# Patient Record
Sex: Male | Born: 1990 | Race: Black or African American | Hispanic: No | Marital: Married | Smoking: Current every day smoker
Health system: Southern US, Community
[De-identification: ages and names within clinical notes are randomized; demographics above are authoritative.]

## PROBLEM LIST (undated history)

## (undated) DIAGNOSIS — G2581 Restless legs syndrome: Secondary | ICD-10-CM

## (undated) HISTORY — PX: ESOPHAGOGASTRODUODENOSCOPY: SHX1529

---

## 2016-01-19 ENCOUNTER — Emergency Department (HOSPITAL_BASED_OUTPATIENT_CLINIC_OR_DEPARTMENT_OTHER): Payer: Self-pay

## 2016-01-19 ENCOUNTER — Emergency Department (HOSPITAL_BASED_OUTPATIENT_CLINIC_OR_DEPARTMENT_OTHER)
Admission: EM | Admit: 2016-01-19 | Discharge: 2016-01-19 | Disposition: A | Discharge status: Home / Self Care | Payer: Self-pay | Attending: Emergency Medicine | Admitting: Emergency Medicine

## 2016-01-19 ENCOUNTER — Encounter (HOSPITAL_BASED_OUTPATIENT_CLINIC_OR_DEPARTMENT_OTHER): Payer: Self-pay | Admitting: *Deleted

## 2016-01-19 DIAGNOSIS — R1013 Epigastric pain: Secondary | ICD-10-CM

## 2016-01-19 DIAGNOSIS — K5900 Constipation, unspecified: Secondary | ICD-10-CM

## 2016-01-19 DIAGNOSIS — R630 Anorexia: Secondary | ICD-10-CM | POA: Insufficient documentation

## 2016-01-19 LAB — CBC WITH DIFFERENTIAL/PLATELET
BASOS PCT: 0 %
Basophils Absolute: 0 10*3/uL (ref 0.0–0.1)
EOS ABS: 0.1 10*3/uL (ref 0.0–0.7)
EOS PCT: 1 %
HCT: 43.9 % (ref 39.0–52.0)
HEMOGLOBIN: 14.9 g/dL (ref 13.0–17.0)
Lymphocytes Relative: 29 %
Lymphs Abs: 2.1 10*3/uL (ref 0.7–4.0)
MCH: 28 pg (ref 26.0–34.0)
MCHC: 33.9 g/dL (ref 30.0–36.0)
MCV: 82.4 fL (ref 78.0–100.0)
Monocytes Absolute: 0.6 10*3/uL (ref 0.1–1.0)
Monocytes Relative: 9 %
NEUTROS PCT: 61 %
Neutro Abs: 4.5 10*3/uL (ref 1.7–7.7)
PLATELETS: 170 10*3/uL (ref 150–400)
RBC: 5.33 MIL/uL (ref 4.22–5.81)
RDW: 12.4 % (ref 11.5–15.5)
WBC: 7.4 10*3/uL (ref 4.0–10.5)

## 2016-01-19 LAB — HEPATIC FUNCTION PANEL
ALT: 13 U/L — ABNORMAL LOW (ref 17–63)
AST: 17 U/L (ref 15–41)
Albumin: 4.1 g/dL (ref 3.5–5.0)
Alkaline Phosphatase: 59 U/L (ref 38–126)
Bilirubin, Direct: 0.1 mg/dL (ref 0.1–0.5)
Indirect Bilirubin: 1.2 mg/dL — ABNORMAL HIGH (ref 0.3–0.9)
Total Bilirubin: 1.3 mg/dL — ABNORMAL HIGH (ref 0.3–1.2)
Total Protein: 7.1 g/dL (ref 6.5–8.1)

## 2016-01-19 LAB — BASIC METABOLIC PANEL
Anion gap: 6 (ref 5–15)
BUN: 14 mg/dL (ref 6–20)
CHLORIDE: 104 mmol/L (ref 101–111)
CO2: 25 mmol/L (ref 22–32)
CREATININE: 1.05 mg/dL (ref 0.61–1.24)
Calcium: 8.7 mg/dL — ABNORMAL LOW (ref 8.9–10.3)
Glucose, Bld: 106 mg/dL — ABNORMAL HIGH (ref 65–99)
POTASSIUM: 3.7 mmol/L (ref 3.5–5.1)
SODIUM: 135 mmol/L (ref 135–145)

## 2016-01-19 LAB — URINALYSIS, ROUTINE W REFLEX MICROSCOPIC
Bilirubin Urine: NEGATIVE
Glucose, UA: NEGATIVE mg/dL
Hgb urine dipstick: NEGATIVE
Ketones, ur: NEGATIVE mg/dL
Leukocytes, UA: NEGATIVE
Nitrite: NEGATIVE
Protein, ur: NEGATIVE mg/dL
Specific Gravity, Urine: 1.019 (ref 1.005–1.030)
pH: 6.5 (ref 5.0–8.0)

## 2016-01-19 LAB — LIPASE, BLOOD: Lipase: 24 U/L (ref 11–51)

## 2016-01-19 MED ORDER — POLYETHYLENE GLYCOL 3350 17 GM/SCOOP PO POWD: 1.0000 | Freq: Once | ORAL | Status: DC

## 2016-01-19 MED ORDER — IOHEXOL 300 MG/ML  SOLN
100.0000 mL | Freq: Once | INTRAMUSCULAR | Status: AC | PRN
  Administered 2016-01-19: 100 mL via INTRAVENOUS

## 2016-01-19 MED ORDER — IOHEXOL 300 MG/ML  SOLN
25.0000 mL | Freq: Once | INTRAMUSCULAR | Status: AC | PRN
  Administered 2016-01-19: 25 mL via ORAL

## 2016-01-19 MED ORDER — GI COCKTAIL ~~LOC~~
30.0000 mL | Freq: Once | ORAL | Status: AC
  Administered 2016-01-19: 30 mL via ORAL
  Filled 2016-01-19: qty 30

## 2016-01-19 MED ORDER — SODIUM CHLORIDE 0.9 % IV BOLUS (SEPSIS)
1000.0000 mL | Freq: Once | INTRAVENOUS | Status: AC
  Administered 2016-01-19: 1000 mL via INTRAVENOUS

## 2016-01-19 NOTE — Discharge Instructions (Signed)
Abdominal Pain, Adult °Many things can cause abdominal pain. Usually, abdominal pain is not caused by a disease and will improve without treatment. It can often be observed and treated at home. Your health care provider will do a physical exam and possibly order blood tests and X-rays to help determine the seriousness of your pain. However, in many cases, more time must pass before a clear cause of the pain can be found. Before that point, your health care provider may not know if you need more testing or further treatment. °HOME CARE INSTRUCTIONS °Monitor your abdominal pain for any changes. The following actions may help to alleviate any discomfort you are experiencing: °· Only take over-the-counter or prescription medicines as directed by your health care provider. °· Do not take laxatives unless directed to do so by your health care provider. °· Try a clear liquid diet (broth, tea, or water) as directed by your health care provider. Slowly move to a bland diet as tolerated. °SEEK MEDICAL CARE IF: °· You have unexplained abdominal pain. °· You have abdominal pain associated with nausea or diarrhea. °· You have pain when you urinate or have a bowel movement. °· You experience abdominal pain that wakes you in the night. °· You have abdominal pain that is worsened or improved by eating food. °· You have abdominal pain that is worsened with eating fatty foods. °· You have a fever. °SEEK IMMEDIATE MEDICAL CARE IF: °· Your pain does not go away within 2 hours. °· You keep throwing up (vomiting). °· Your pain is felt only in portions of the abdomen, such as the right side or the left lower portion of the abdomen. °· You pass bloody or black tarry stools. °MAKE SURE YOU: °· Understand these instructions. °· Will watch your condition. °· Will get help right away if you are not doing well or get worse. °  °This information is not intended to replace advice given to you by your health care provider. Make sure you discuss  any questions you have with your health care provider. °  °Document Released: 09/15/2005 Document Revised: 08/27/2015 Document Reviewed: 08/15/2013 °Elsevier Interactive Patient Education ©2016 [employer] ° °Constipation, Adult °Constipation is when a person: °· Poops (has a bowel movement) less than 3 times a week. °· Has a hard time pooping. °· Has poop that is dry, hard, or bigger than normal. °HOME CARE  °· Eat foods with a lot of fiber in them. This includes fruits, vegetables, beans, and whole grains such as brown rice. °· Avoid fatty foods and foods with a lot of sugar. This includes french fries, hamburgers, cookies, candy, and soda. °· If you are not getting enough fiber from food, take products with added fiber in them (supplements). °· Drink enough fluid to keep your pee (urine) clear or pale yellow. °· Exercise on a regular basis, or as told by your doctor. °· Go to the restroom when you feel like you need to poop. Do not hold it. °· Only take medicine as told by your doctor. Do not take medicines that help you poop (laxatives) without talking to your doctor first. °GET HELP RIGHT AWAY IF:  °· You have bright red blood in your poop (stool). °· Your constipation lasts more than 4 days or gets worse. °· You have belly (abdominal) or butt (rectal) pain. °· You have thin poop (as thin as a pencil). °· You lose weight, and it cannot be explained. °MAKE SURE YOU:  °· Understand these instructions. °·   Will watch your condition.  Will get help right away if you are not doing well or get worse.   This information is not intended to replace advice given to you by your health care provider. Make sure you discuss any questions you have with your health care provider.   Take MiraLAX daily. Adjust dose as needed. Follow-up with her primary care doctor. Return to the ED if you experience increasing your pain, fever, chills, blood in your stool, blood in your vomit.

## 2016-01-19 NOTE — ED Notes (Signed)
C/o sharp abd pain x 1 week w nausea,  Denies vomiting, denies penile dc,  No fever or diarrhea

## 2016-01-19 NOTE — ED Notes (Signed)
Abd pain and nausea x 1 week. Reports decreased appetite. Point to epigastric area for location of pain

## 2016-01-21 NOTE — ED Provider Notes (Signed)
CSN: [id number]     Arrival date & time 01/19/16  1950 History   First MD Initiated Contact with Patient 01/19/16 2049     Chief Complaint  Patient presents with  . Abdominal Pain     (Consider location/radiation/quality/duration/timing/severity/associated sxs/prior Treatment) HPI   Dustin Wheeler is a 25 y.o M with no significant pmhx who presents to the ED c/o abdominal pain. Pt states that over the last week he has has intermittent epigastric abdominal pain that is sharp in nature. Pain is worsened with movement. No alleviating factors. Pt also has associated nausea and decreased appetite. No vomiting or diarrhea. Pt has also been constipated, last BM was 3 days ago. Pt is still passing gas. Denies fever, chills, melena, hematochezia, dysuria.   History reviewed. No pertinent past medical history. History reviewed. No pertinent past surgical history. No family history on file. Social History  Substance Use Topics  . Smoking status: Never Smoker   . Smokeless tobacco: Never Used  . Alcohol Use: No    Review of Systems  All other systems reviewed and are negative.     Allergies  Review of patient's allergies indicates no known allergies.  Home Medications   Prior to Admission medications   Medication Sig Start Date End Date Taking? Authorizing Provider  polyethylene glycol powder (GLYCOLAX) powder Take 255 g by mouth once. 01/19/16   Samantha Tripp Dowless, PA-C   BP 126/63 mmHg  Pulse 61  Resp 16  SpO2 100% Physical Exam  Constitutional: He is oriented to person, place, and time. He appears well-developed and well-nourished. No distress.  HENT:  Head: Normocephalic and atraumatic.  Mouth/Throat: No oropharyngeal exudate.  Eyes: Conjunctivae and EOM are normal. Pupils are equal, round, and reactive to light. Right eye exhibits no discharge. Left eye exhibits no discharge. No scleral icterus.  Cardiovascular: Normal rate, regular rhythm, normal heart sounds and intact  distal pulses.  Exam reveals no gallop and no friction rub.   No murmur heard. Pulmonary/Chest: Effort normal and breath sounds normal. No respiratory distress. He has no wheezes. He has no rales. He exhibits no tenderness.  Abdominal: Soft. Bowel sounds are normal. He exhibits no distension and no mass. There is no tenderness. There is no rebound and no guarding.  Musculoskeletal: Normal range of motion. He exhibits no edema.  Neurological: He is alert and oriented to person, place, and time.  Skin: Skin is warm and dry. No rash noted. He is not diaphoretic. No erythema. No pallor.  Psychiatric: He has a normal mood and affect. His behavior is normal.  Nursing note and vitals reviewed.   ED Course  Procedures (including critical care time) Labs Review Labs Reviewed  BASIC METABOLIC PANEL - Abnormal; Notable for the following:    Glucose, Bld 106 (*)    Calcium 8.7 (*)    All other components within normal limits  HEPATIC FUNCTION PANEL - Abnormal; Notable for the following:    ALT 13 (*)    Total Bilirubin 1.3 (*)    Indirect Bilirubin 1.2 (*)    All other components within normal limits  CBC WITH DIFFERENTIAL/PLATELET  LIPASE, BLOOD  URINALYSIS, ROUTINE W REFLEX MICROSCOPIC (NOT AT [facility])    Imaging Review Ct Abdomen Pelvis W Contrast  01/19/2016  CLINICAL DATA:  Epigastric pain for 2 weeks EXAM: CT ABDOMEN AND PELVIS WITH CONTRAST TECHNIQUE: Multidetector CT imaging of the abdomen and pelvis was performed using the standard protocol following bolus administration of intravenous contrast. CONTRAST:  OMNIPAQUE IOHEXOL 300 MG/ML SOLN, 25mL OMNIPAQUE IOHEXOL 300 MG/ML SOLN COMPARISON:  None. FINDINGS: Lower chest: The lung bases are clear. No pleural or pericardial effusion. Hepatobiliary: No focal liver abnormality. The gallbladder is normal. No biliary dilatation. Pancreas: Negative Spleen: The spleen is normal. Adrenals/Urinary Tract: The adrenal glands are normal. Normal  appearance of both kidneys. The urinary bladder appears normal. Stomach/Bowel: The stomach and the small bowel loops have a normal course and caliber. No evidence for bowel obstruction. The appendix is visualized and appears normal. No pathologic dilatation of the large bowel loops. Vascular/Lymphatic: Normal appearance of the abdominal aorta. No enlarged retroperitoneal or mesenteric adenopathy. No enlarged pelvic or inguinal lymph nodes. Reproductive: The prostate gland and seminal vesicles are unremarkable. Other: There is no ascites or focal fluid collections within the abdomen or pelvis. There is a small periumbilical hernia which contains fat only, image 57 of series 6. Musculoskeletal: No aggressive lytic or sclerotic bone lesions. IMPRESSION: 1. No acute findings within the abdomen or pelvis. 2. Small periumbilical hernia contains fat only. Electronically Signed   By: Signa Kell M.D.   On: 01/19/2016 23:06   I have personally reviewed and evaluated these images and lab results as part of my medical decision-making.   EKG Interpretation None      MDM   Final diagnoses:  Epigastric pain  Constipation, unspecified constipation type    Patient is nontoxic, nonseptic appearing, in no apparent distress.  Pt denies current pain or nausea.  Fluid bolus given.  Labs, imaging and vitals reviewed.  Patient does not meet the SIRS or Sepsis criteria.  On repeat exam patient does not have a surgical abdomin and there are no peritoneal signs.  No indication of appendicitis, bowel obstruction, bowel perforation, cholecystitis, diverticulitis.  Patient discharged home with symptomatic treatment and given strict instructions for follow-up with their primary care physician. Pt given miralax for constipation. I have also discussed reasons to return immediately to the ER.  Patient expresses understanding and agrees with plan.       Lester Kinsman [place], PA-C 01/21/16 2145  Geoffery Lyons,  MD 01/22/16 [phone]

## 2017-02-21 ENCOUNTER — Emergency Department (HOSPITAL_BASED_OUTPATIENT_CLINIC_OR_DEPARTMENT_OTHER)
Admission: EM | Admit: 2017-02-21 | Discharge: 2017-02-21 | Disposition: A | Discharge status: Home / Self Care | Payer: Self-pay | Attending: Emergency Medicine | Admitting: Emergency Medicine

## 2017-02-21 ENCOUNTER — Encounter (HOSPITAL_BASED_OUTPATIENT_CLINIC_OR_DEPARTMENT_OTHER): Payer: Self-pay | Admitting: *Deleted

## 2017-02-21 DIAGNOSIS — X58XXXA Exposure to other specified factors, initial encounter: Secondary | ICD-10-CM | POA: Insufficient documentation

## 2017-02-21 DIAGNOSIS — T1501XA Foreign body in cornea, right eye, initial encounter: Secondary | ICD-10-CM | POA: Insufficient documentation

## 2017-02-21 DIAGNOSIS — Y939 Activity, unspecified: Secondary | ICD-10-CM | POA: Insufficient documentation

## 2017-02-21 DIAGNOSIS — Y999 Unspecified external cause status: Secondary | ICD-10-CM | POA: Insufficient documentation

## 2017-02-21 DIAGNOSIS — Y929 Unspecified place or not applicable: Secondary | ICD-10-CM | POA: Insufficient documentation

## 2017-02-21 MED ORDER — TETRACAINE HCL 0.5 % OP SOLN
1.0000 [drp] | Freq: Once | OPHTHALMIC | Status: AC
  Administered 2017-02-21: 1 [drp] via OPHTHALMIC

## 2017-02-21 MED ORDER — TETRACAINE HCL 0.5 % OP SOLN
1.0000 [drp] | Freq: Once | OPHTHALMIC | Status: AC
  Administered 2017-02-21: 1 [drp] via OPHTHALMIC
  Filled 2017-02-21: qty 4

## 2017-02-21 MED ORDER — CIPROFLOXACIN HCL 0.3 % OP SOLN: 2.0000 [drp] | OPHTHALMIC | Status: DC

## 2017-02-21 MED ORDER — HYDROCODONE-ACETAMINOPHEN 5-325 MG PO TABS: 1.0000 | ORAL_TABLET | Freq: Four times a day (QID) | ORAL | 0 refills | Status: DC | PRN

## 2017-02-21 MED ORDER — FLUORESCEIN SODIUM 0.6 MG OP STRP
1.0000 | ORAL_STRIP | Freq: Once | OPHTHALMIC | Status: DC
  Filled 2017-02-21: qty 1

## 2017-02-21 MED ORDER — OFLOXACIN 0.3 % OP SOLN
OPHTHALMIC | Status: AC
  Administered 2017-02-21: 2 [drp]
  Filled 2017-02-21: qty 5

## 2017-02-21 NOTE — ED Notes (Signed)
ED Provider at bedside. 

## 2017-02-21 NOTE — ED Notes (Signed)
Pt is resting comfortably. Eyes closed.

## 2017-02-21 NOTE — ED Notes (Signed)
MD with pt  

## 2017-02-21 NOTE — ED Triage Notes (Signed)
Pt states he may have gotten a piece of metal in his eye yesterday. Unable to hold eye open enough to really see out of it

## 2017-02-21 NOTE — ED Notes (Signed)
Pt was triaged by this RN.

## 2017-02-21 NOTE — ED Provider Notes (Addendum)
[facility] Provider Note: Lowella DellJ. Lane Johny Pitstick, MD, FACEP  CSN: [id number] MRN: [id number] ARRIVAL: 02/21/17 at 0025 ROOM: [facility]   CHIEF COMPLAINT  Eye Injury   HISTORY OF PRESENT ILLNESS  Dustin KocherRicky Givhan is a 26 y.o. male who was using a grinder yesterday and believes he got a speck of metal in his right eye. He is here with moderate foreign body sensation. There is increased tearing of the right eye with associated redness. His vision is not significantly blurry. He has not been able to wash the object from his eye. Pain is worse with exposure to light.  [place] reveals the patient has received No opioid prescriptions in the past year.   History reviewed. No pertinent past medical history.  History reviewed. No pertinent surgical history.  History reviewed. No pertinent family history.  Social History  Substance Use Topics  . Smoking status: Never Smoker  . Smokeless tobacco: Never Used  . Alcohol use No    Prior to Admission medications   Not on File    Allergies Patient has no known allergies.   REVIEW OF SYSTEMS  Negative except as noted here or in the History of Present Illness.   PHYSICAL EXAMINATION  Initial Vital Signs Blood pressure 146/65, pulse 64, temperature 98 F (36.7 C), temperature source Oral, resp. rate 18, height 6\' 2"  (1.88 m), weight 160 lb (72.6 kg), SpO2 97 %.  Examination General: Well-developed, well-nourished male in no acute distress; appearance consistent with age of record HENT: normocephalic; atraumatic Eyes: pupils equal, round and reactive to light; extraocular muscles intact; right conjunctival injection with increased tearing; small metallic appearing foreign object in lateral edge of right cornea Neck: supple Heart: regular rate and rhythm Lungs: Normal respiratory effort and excursion Abdomen: soft; nondistended Extremities: No deformity; full range of  motion Neurologic: Awake, alert and oriented; motor function intact in all extremities and symmetric; no facial droop Skin: Warm and dry Psychiatric: Anxious   RESULTS  Summary of this visit's results, reviewed by myself:   EKG Interpretation  Date/Time:    Ventricular Rate:    PR Interval:    QRS Duration:   QT Interval:    QTC Calculation:   R Axis:     Text Interpretation:        Laboratory Studies: No results found for this or any previous visit (from the past 24 hour(s)). Imaging Studies: No results found.  ED COURSE  Nursing notes and initial vitals signs, including pulse oximetry, reviewed.  Vitals:   02/21/17 0036 02/21/17 0037  BP: 146/65   Pulse: 64   Resp: 18   Temp: 98 F (36.7 C)   TempSrc: Oral   SpO2: 97%   Weight:  160 lb (72.6 kg)  Height:  6\' 2"  (1.88 m)    PROCEDURES   FOREIGN BODY REMOVAL The right eye was anesthetized with several drops of tetracaine solution. An 18-gauge needle was then used to remove the foreign object from the right cornea. There appears to be a faint rust ring remaining. The patient tolerated this well and there were no immediate complications. We will refer to ophthalmology for reevaluation later today.  ED DIAGNOSES     ICD-9-CM ICD-10-CM   1. 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

## 2017-07-13 ENCOUNTER — Emergency Department (HOSPITAL_BASED_OUTPATIENT_CLINIC_OR_DEPARTMENT_OTHER): Payer: Self-pay

## 2017-07-13 ENCOUNTER — Encounter (HOSPITAL_BASED_OUTPATIENT_CLINIC_OR_DEPARTMENT_OTHER): Payer: Self-pay

## 2017-07-13 ENCOUNTER — Emergency Department (HOSPITAL_BASED_OUTPATIENT_CLINIC_OR_DEPARTMENT_OTHER)
Admission: EM | Admit: 2017-07-13 | Discharge: 2017-07-13 | Disposition: A | Discharge status: Home / Self Care | Payer: Self-pay | Attending: Emergency Medicine | Admitting: Emergency Medicine

## 2017-07-13 DIAGNOSIS — R519 Headache, unspecified: Secondary | ICD-10-CM

## 2017-07-13 DIAGNOSIS — F17200 Nicotine dependence, unspecified, uncomplicated: Secondary | ICD-10-CM | POA: Insufficient documentation

## 2017-07-13 DIAGNOSIS — R51 Headache: Secondary | ICD-10-CM | POA: Insufficient documentation

## 2017-07-13 MED ORDER — ACETAMINOPHEN 500 MG PO TABS
1000.0000 mg | ORAL_TABLET | Freq: Once | ORAL | Status: AC
  Administered 2017-07-13: 1000 mg via ORAL
  Filled 2017-07-13: qty 2

## 2017-07-13 NOTE — Discharge Instructions (Signed)
It was our pleasure to provide your ER care today - we hope that you feel better.  Rest. Drink adequate fluids.  Try take excedrin migraine as need for headache.  Given frequency of headaches, follow up with neurologist/headache specialist in the next few weeks - see referral - call to arrange appointment.  Return to ER if worse, new symptoms, other medical emergency.

## 2017-07-13 NOTE — ED Provider Notes (Signed)
[facility]   CSN: [id number] Arrival date & time: 07/13/17  1349     History   Chief Complaint Chief Complaint  Patient presents with  . Headache    HPI Letta KocherRicky Ofarrell is a 26 y.o. male.  Patient c/o headaches for the past 4-5 months.  States only occasional headache prior to that but having daily/constant headaches for 4-5 months. Located frontal and superior. Pain is dull, moderate, constant, throbbing, without radiation. No specific exacerbating or alleviating factors. No neck pain or stiffness. No eye pain or change in vision. No change in speech. No numbness/weakness or change in normal functional ability. No personal or fam hx migraines. Denies sinus drainage or congestion. No allergy symptoms. No fever or chills.    The history is provided by the patient.  Headache   Pertinent negatives include no fever, no shortness of breath and no vomiting.    History reviewed. No pertinent past medical history.  There are no active problems to display for this patient.   History reviewed. No pertinent surgical history.     Home Medications    Prior to Admission medications   Not on File    Family History No family history on file.  Social History Social History  Substance Use Topics  . Smoking status: Current Every Day Smoker  . Smokeless tobacco: Never Used  . Alcohol use No     Allergies   Patient has no known allergies.   Review of Systems Review of Systems  Constitutional: Negative for fever.  HENT: Negative for sinus pain and sore throat.   Eyes: Negative for pain and visual disturbance.  Respiratory: Negative for shortness of breath.   Cardiovascular: Negative for chest pain.  Gastrointestinal: Negative for abdominal pain and vomiting.  Genitourinary: Negative for flank pain.  Musculoskeletal: Negative for back pain, neck pain and neck stiffness.  Skin: Negative for rash.  Neurological: Positive for headaches. Negative for  weakness and numbness.  Hematological: Does not bruise/bleed easily.  Psychiatric/Behavioral: Negative for confusion.     Physical Exam Updated Vital Signs BP 132/82 (BP Location: Left Arm)   Pulse 74   Temp 98.5 F (36.9 C) (Oral)   Resp 18   Ht 1.88 m (6\' 2" )   Wt 74.8 kg (165 lb)   SpO2 100%   BMI 21.18 kg/m   Physical Exam  Constitutional: He is oriented to person, place, and time. He appears well-developed and well-nourished. No distress.  HENT:  Mouth/Throat: Oropharynx is clear and moist.  No sinus or temporal tenderness.   Eyes: Conjunctivae are normal.  Neck: Neck supple. No tracheal deviation present.  Cardiovascular: Normal rate.   Pulmonary/Chest: Effort normal. No accessory muscle usage. No respiratory distress.  Abdominal: He exhibits no distension.  Musculoskeletal: He exhibits no edema.  Neurological: He is alert and oriented to person, place, and time. No cranial nerve deficit.  Motor intact bil, stre 5/5. sens grossly intact. Steady gait  Skin: Skin is warm and dry. No rash noted.  Psychiatric: He has a normal mood and affect.  Nursing note and vitals reviewed.    ED Treatments / Results  Labs (all labs ordered are listed, but only abnormal results are displayed) Labs Reviewed - No data to display  EKG  EKG Interpretation None       Radiology Ct Head Wo Contrast  Result Date: 07/13/2017 CLINICAL DATA:  Left-sided headache for several months. EXAM: CT HEAD WITHOUT CONTRAST TECHNIQUE: Contiguous axial images were obtained  from the base of the skull through the vertex without intravenous contrast. COMPARISON:  None. FINDINGS: Brain: No acute infarct, hemorrhage, or mass lesion is present. The ventricles are of normal size. No significant extraaxial fluid collection is present. The brainstem and cerebellum are normal. Vascular: No hyperdense vessel or unexpected calcification. Skull: The calvarium is intact. No focal lytic or blastic lesions are  present. Sinuses/Orbits: The paranasal sinuses and mastoid air cells are clear. The globes and orbits are within normal limits. IMPRESSION: Negative CT of the head. Electronically Signed   By: Marin Robertshristopher  Mattern M.D.   On: 07/13/2017 14:27    Procedures Procedures (including critical care time)  Medications Ordered in ED Medications  acetaminophen (TYLENOL) tablet 1,000 mg (1,000 mg Oral Given 07/13/17 1422)     Initial Impression / Assessment and Plan / ED Course  I have reviewed the triage vital signs and the nursing notes.  Pertinent labs & imaging results that were available during my care of the patient were reviewed by me and considered in my medical decision making (see chart for details).  Took ibuprofen earlier today.  Acetaminophen po.  As constant, daily headaches x 4-5 months, will get imaging study.  Ct neg acute.   Patient appears stable for d/c.     Final Clinical Impressions(s) / ED Diagnoses   Final diagnoses:  None    New Prescriptions New Prescriptions   No medications on file     Cathren LaineSteinl, Ishan Sanroman, MD 07/13/17 1432

## 2017-07-13 NOTE — ED Triage Notes (Signed)
C/o HA "for months"-came in to day because he had a HA yesterday at work and vomited x 1-NAD-steady gait

## 2017-07-22 ENCOUNTER — Emergency Department (HOSPITAL_BASED_OUTPATIENT_CLINIC_OR_DEPARTMENT_OTHER)
Admission: EM | Admit: 2017-07-22 | Discharge: 2017-07-23 | Disposition: A | Discharge status: Home / Self Care | Payer: Self-pay | Attending: Emergency Medicine | Admitting: Emergency Medicine

## 2017-07-22 ENCOUNTER — Encounter (HOSPITAL_BASED_OUTPATIENT_CLINIC_OR_DEPARTMENT_OTHER): Payer: Self-pay | Admitting: *Deleted

## 2017-07-22 DIAGNOSIS — R112 Nausea with vomiting, unspecified: Secondary | ICD-10-CM

## 2017-07-22 DIAGNOSIS — R519 Headache, unspecified: Secondary | ICD-10-CM

## 2017-07-22 DIAGNOSIS — F17210 Nicotine dependence, cigarettes, uncomplicated: Secondary | ICD-10-CM | POA: Insufficient documentation

## 2017-07-22 DIAGNOSIS — R51 Headache: Secondary | ICD-10-CM | POA: Insufficient documentation

## 2017-07-22 NOTE — ED Triage Notes (Addendum)
Pt c/o abd pain n/v x 4 days Pt states taking Excedrin migraine x 1 week for h/a

## 2017-07-23 LAB — COMPREHENSIVE METABOLIC PANEL
ALBUMIN: 4.5 g/dL (ref 3.5–5.0)
ALT: 12 U/L — ABNORMAL LOW (ref 17–63)
ANION GAP: 10 (ref 5–15)
AST: 18 U/L (ref 15–41)
Alkaline Phosphatase: 57 U/L (ref 38–126)
BUN: 13 mg/dL (ref 6–20)
CO2: 26 mmol/L (ref 22–32)
Calcium: 9 mg/dL (ref 8.9–10.3)
Chloride: 100 mmol/L — ABNORMAL LOW (ref 101–111)
Creatinine, Ser: 1 mg/dL (ref 0.61–1.24)
GFR calc non Af Amer: >60 mL/min (ref >60)
GLUCOSE: 105 mg/dL — AB (ref 65–99)
POTASSIUM: 3.8 mmol/L (ref 3.5–5.1)
SODIUM: 136 mmol/L (ref 135–145)
TOTAL PROTEIN: 7.3 g/dL (ref 6.5–8.1)
Total Bilirubin: 1.5 mg/dL — ABNORMAL HIGH (ref 0.3–1.2)

## 2017-07-23 LAB — CBC WITH DIFFERENTIAL/PLATELET
BASOS PCT: 0 %
Basophils Absolute: 0 10*3/uL (ref 0.0–0.1)
EOS ABS: 0 10*3/uL (ref 0.0–0.7)
EOS PCT: 0 %
HCT: 45.5 % (ref 39.0–52.0)
Hemoglobin: 16 g/dL (ref 13.0–17.0)
Lymphocytes Relative: 16 %
Lymphs Abs: 1.7 10*3/uL (ref 0.7–4.0)
MCH: 29 pg (ref 26.0–34.0)
MCHC: 35.2 g/dL (ref 30.0–36.0)
MCV: 82.4 fL (ref 78.0–100.0)
MONO ABS: 0.9 10*3/uL (ref 0.1–1.0)
MONOS PCT: 8 %
Neutro Abs: 8.2 10*3/uL — ABNORMAL HIGH (ref 1.7–7.7)
Neutrophils Relative %: 76 %
Platelets: 202 10*3/uL (ref 150–400)
RBC: 5.52 MIL/uL (ref 4.22–5.81)
RDW: 13 % (ref 11.5–15.5)
WBC: 10.8 10*3/uL — ABNORMAL HIGH (ref 4.0–10.5)

## 2017-07-23 LAB — URINALYSIS, ROUTINE W REFLEX MICROSCOPIC
Glucose, UA: NEGATIVE mg/dL
HGB URINE DIPSTICK: NEGATIVE
Ketones, ur: 15 mg/dL — AB
Leukocytes, UA: NEGATIVE
Nitrite: NEGATIVE
PH: 5.5 (ref 5.0–8.0)
Protein, ur: NEGATIVE mg/dL
SPECIFIC GRAVITY, URINE: 1.031 — AB (ref 1.005–1.030)

## 2017-07-23 LAB — LIPASE, BLOOD: Lipase: 24 U/L (ref 11–51)

## 2017-07-23 MED ORDER — METOCLOPRAMIDE HCL 10 MG PO TABS: 10.0000 mg | ORAL_TABLET | Freq: Four times a day (QID) | ORAL | 0 refills | Status: DC | PRN

## 2017-07-23 MED ORDER — SODIUM CHLORIDE 0.9 % IV BOLUS (SEPSIS): 1000.0000 mL | Freq: Once | INTRAVENOUS | Status: DC

## 2017-07-23 MED ORDER — PANTOPRAZOLE SODIUM 40 MG IV SOLR
40.0000 mg | Freq: Once | INTRAVENOUS | Status: AC
  Administered 2017-07-23: 40 mg via INTRAVENOUS
  Filled 2017-07-23: qty 40

## 2017-07-23 MED ORDER — DIPHENHYDRAMINE HCL 50 MG/ML IJ SOLN
25.0000 mg | Freq: Once | INTRAMUSCULAR | Status: AC
  Administered 2017-07-23: 25 mg via INTRAVENOUS
  Filled 2017-07-23: qty 1

## 2017-07-23 MED ORDER — KETOROLAC TROMETHAMINE 15 MG/ML IJ SOLN
15.0000 mg | Freq: Once | INTRAMUSCULAR | Status: AC
  Administered 2017-07-23: 15 mg via INTRAVENOUS
  Filled 2017-07-23: qty 1

## 2017-07-23 MED ORDER — SODIUM CHLORIDE 0.9 % IV BOLUS (SEPSIS)
1000.0000 mL | Freq: Once | INTRAVENOUS | Status: AC
  Administered 2017-07-23: 1000 mL via INTRAVENOUS

## 2017-07-23 MED ORDER — ONDANSETRON HCL 4 MG/2ML IJ SOLN
4.0000 mg | Freq: Once | INTRAMUSCULAR | Status: AC
  Administered 2017-07-23: 4 mg via INTRAVENOUS
  Filled 2017-07-23: qty 2

## 2017-07-23 MED ORDER — METOCLOPRAMIDE HCL 5 MG/ML IJ SOLN
10.0000 mg | Freq: Once | INTRAMUSCULAR | Status: AC
  Administered 2017-07-23: 10 mg via INTRAVENOUS
  Filled 2017-07-23: qty 2

## 2017-07-23 NOTE — ED Provider Notes (Signed)
[facility] Provider Note: Dustin DellJ. Lane Jeslie Lowe, MD, FACEP  CSN: [id number] MRN: [id number] ARRIVAL: 07/22/17 at 2352 ROOM: MH09/MH09   CHIEF COMPLAINT  Abdominal Pain   HISTORY OF PRESENT ILLNESS  07/23/17 12:27 AM Dustin Wheeler is a 26 y.o. male with a four-day history of nausea and vomiting. He is typically vomited once per day. He has had decreased oral intake because eating makes him nauseated. His vomiting is preceded by epigastric pain which she rates as an 8 out of 10. The pain is resolved by vomiting. He denies abdominal pain is present time. He denies a headache at the present time but has been having headaches recently for which he has been taking over-the-counter migraine tablets. He denies diarrhea. He denies fever.   History reviewed. No pertinent past medical history.  History reviewed. No pertinent surgical history.  History reviewed. No pertinent family history.  Social History  Substance Use Topics  . Smoking status: Current Every Day Smoker  . Smokeless tobacco: Never Used  . Alcohol use No    Prior to Admission medications   Not on File    Allergies Patient has no known allergies.   REVIEW OF SYSTEMS  Negative except as noted here or in the History of Present Illness.   PHYSICAL EXAMINATION  Initial Vital Signs Blood pressure (!) 141/86, pulse 84, temperature 98.8 F (37.1 C), temperature source Oral, resp. rate 16, height 6\' 2"  (1.88 m), weight 74.8 kg (165 lb), SpO2 97 %.  Examination General: Well-developed, well-nourished male in no acute distress; appearance consistent with age of record HENT: normocephalic; atraumatic Eyes: pupils equal, round and reactive to light; extraocular muscles intact Neck: supple Heart: regular rate and rhythm Lungs: clear to auscultation bilaterally Abdomen: soft; nondistended; mild epigastric tenderness; no masses or hepatosplenomegaly; bowel sounds present Extremities: No deformity; full range of motion;  pulses normal Neurologic: Awake, alert and oriented; motor function intact in all extremities and symmetric; no facial droop Skin: Warm and dry Psychiatric: Flat affect   RESULTS  Summary of this visit's results, reviewed by myself:   EKG Interpretation  Date/Time:    Ventricular Rate:    PR Interval:    QRS Duration:   QT Interval:    QTC Calculation:   R Axis:     Text Interpretation:        Laboratory Studies: Results for orders placed or performed during the hospital encounter of 07/22/17 (from the past 24 hour(s))  CBC with Differential     Status: Abnormal   Collection Time: 07/23/17 12:24 AM  Result Value Ref Range   WBC 10.8 (H) 4.0 - 10.5 K/uL   RBC 5.52 4.22 - 5.81 MIL/uL   Hemoglobin 16.0 13.0 - 17.0 g/dL   HCT 78.245.5 95.639.0 - 21.352.0 %   MCV 82.4 78.0 - 100.0 fL   MCH 29.0 26.0 - 34.0 pg   MCHC 35.2 30.0 - 36.0 g/dL   RDW 08.613.0 57.811.5 - 46.915.5 %   Platelets 202 150 - 400 K/uL   Neutrophils Relative % 76 %   Neutro Abs 8.2 (H) 1.7 - 7.7 K/uL   Lymphocytes Relative 16 %   Lymphs Abs 1.7 0.7 - 4.0 K/uL   Monocytes Relative 8 %   Monocytes Absolute 0.9 0.1 - 1.0 K/uL   Eosinophils Relative 0 %   Eosinophils Absolute 0.0 0.0 - 0.7 K/uL   Basophils Relative 0 %   Basophils Absolute 0.0 0.0 - 0.1 K/uL  Comprehensive metabolic panel  Status: Abnormal   Collection Time: 07/23/17 12:24 AM  Result Value Ref Range   Sodium 136 135 - 145 mmol/L   Potassium 3.8 3.5 - 5.1 mmol/L   Chloride 100 (L) 101 - 111 mmol/L   CO2 26 22 - 32 mmol/L   Glucose, Bld 105 (H) 65 - 99 mg/dL   BUN 13 6 - 20 mg/dL   Creatinine, Ser 1.611.00 0.61 - 1.24 mg/dL   Calcium 9.0 8.9 - 09.610.3 mg/dL   Total Protein 7.3 6.5 - 8.1 g/dL   Albumin 4.5 3.5 - 5.0 g/dL   AST 18 15 - 41 U/L   ALT 12 (L) 17 - 63 U/L   Alkaline Phosphatase 57 38 - 126 U/L   Total Bilirubin 1.5 (H) 0.3 - 1.2 mg/dL   GFR calc non Af Amer >60 >60 mL/min   GFR calc Af Amer >60 >60 mL/min   Anion gap 10 5 - 15  Lipase, blood      Status: None   Collection Time: 07/23/17 12:24 AM  Result Value Ref Range   Lipase 24 11 - 51 U/L  Urinalysis, Routine w reflex microscopic     Status: Abnormal   Collection Time: 07/23/17  1:28 AM  Result Value Ref Range   Color, Urine AMBER (A) YELLOW   APPearance CLEAR CLEAR   Specific Gravity, Urine 1.031 (H) 1.005 - 1.030   pH 5.5 5.0 - 8.0   Glucose, UA NEGATIVE NEGATIVE mg/dL   Hgb urine dipstick NEGATIVE NEGATIVE   Bilirubin Urine SMALL (A) NEGATIVE   Ketones, ur 15 (A) NEGATIVE mg/dL   Protein, ur NEGATIVE NEGATIVE mg/dL   Nitrite NEGATIVE NEGATIVE   Leukocytes, UA NEGATIVE NEGATIVE   Imaging Studies: No results found.  ED COURSE  Nursing notes and initial vitals signs, including pulse oximetry, reviewed.  Vitals:   07/22/17 2356 07/22/17 2357  BP:  (!) 141/86  Pulse:  84  Resp:  16  Temp:  98.8 F (37.1 C)  TempSrc:  Oral  SpO2:  97%  Weight: 74.8 kg (165 lb)   Height: 6\' 2"  (1.88 m)    3:56 AM Patient's nausea relieved with IV Zofran but patient developed a headache soon after. Headache was treated with Toradol, Benadryl and Reglan with improvement. Headache now significantly improved.  PROCEDURES    ED DIAGNOSES     ICD-10-CM   1. Nausea and vomiting in adult R11.2   2. Recurrent headache R51        Dustin Wheeler, Dustin RuizJohn, MD 07/23/17 [phone]

## 2017-10-10 ENCOUNTER — Encounter (HOSPITAL_BASED_OUTPATIENT_CLINIC_OR_DEPARTMENT_OTHER): Payer: Self-pay | Admitting: *Deleted

## 2017-10-10 ENCOUNTER — Emergency Department (HOSPITAL_BASED_OUTPATIENT_CLINIC_OR_DEPARTMENT_OTHER)
Admission: EM | Admit: 2017-10-10 | Discharge: 2017-10-10 | Disposition: A | Discharge status: Home / Self Care | Payer: Self-pay | Attending: Emergency Medicine | Admitting: Emergency Medicine

## 2017-10-10 DIAGNOSIS — K029 Dental caries, unspecified: Secondary | ICD-10-CM | POA: Insufficient documentation

## 2017-10-10 DIAGNOSIS — R591 Generalized enlarged lymph nodes: Secondary | ICD-10-CM | POA: Insufficient documentation

## 2017-10-10 DIAGNOSIS — F17200 Nicotine dependence, unspecified, uncomplicated: Secondary | ICD-10-CM | POA: Insufficient documentation

## 2017-10-10 MED ORDER — NAPROXEN 500 MG PO TABS: 500.0000 mg | ORAL_TABLET | Freq: Two times a day (BID) | ORAL | 0 refills | Status: DC

## 2017-10-10 MED ORDER — AMOXICILLIN 500 MG PO CAPS
500.0000 mg | ORAL_CAPSULE | Freq: Three times a day (TID) | ORAL | Status: DC
  Administered 2017-10-10: 500 mg via ORAL
  Filled 2017-10-10: qty 1

## 2017-10-10 MED ORDER — NAPROXEN 250 MG PO TABS
500.0000 mg | ORAL_TABLET | Freq: Once | ORAL | Status: AC
  Administered 2017-10-10: 500 mg via ORAL
  Filled 2017-10-10: qty 2

## 2017-10-10 MED ORDER — AMOXICILLIN 500 MG PO CAPS: 500.0000 mg | ORAL_CAPSULE | Freq: Three times a day (TID) | ORAL | 0 refills | Status: DC

## 2017-10-10 NOTE — ED Provider Notes (Signed)
[facility] EMERGENCY DEPARTMENT Provider Note   CSN: [id number] Arrival date & time: 10/10/17  1932     History   Chief Complaint No chief complaint on file.   HPI Dustin Wheeler is a 26 y.o. male.  HPI Patient presented to the emergency room for evaluation of a lump on the right side of his neck.  Patient states he has had trouble with a bad wisdom tooth on the lower right side.  A few days ago he noticed a swollen lump.  It is tender to palpation.  He denies any fevers.  No difficulty swallowing.  No recent injuries. History reviewed. No pertinent past medical history.  There are no active problems to display for this patient.   History reviewed. No pertinent surgical history.     Home Medications    Prior to Admission medications   Medication Sig Start Date End Date Taking? Authorizing Provider  amoxicillin (AMOXIL) 500 MG capsule Take 1 capsule (500 mg total) by mouth 3 (three) times daily. 10/10/17   Linwood Dibbles, MD  metoCLOPramide (REGLAN) 10 MG tablet Take 1 tablet (10 mg total) by mouth every 6 (six) hours as needed (for nausea and/or headache). 07/23/17   Molpus, John, MD  naproxen (NAPROSYN) 500 MG tablet Take 1 tablet (500 mg total) by mouth 2 (two) times daily. 10/10/17   Linwood Dibbles, MD    Family History No family history on file.  Social History Social History  Substance Use Topics  . Smoking status: Current Every Day Smoker  . Smokeless tobacco: Never Used  . Alcohol use No     Allergies   Patient has no known allergies.   Review of Systems Review of Systems  All other systems reviewed and are negative.    Physical Exam Updated Vital Signs BP 139/85   Pulse 69   Temp 98.8 F (37.1 C) (Oral)   Resp 16   Ht 1.88 m (6\' 2" )   Wt 81.6 kg (180 lb)   SpO2 100%   BMI 23.11 kg/m   Physical Exam  Constitutional: He appears well-developed and well-nourished. No distress.  HENT:  Head: Normocephalic and atraumatic.  Right Ear:  External ear normal.  Left Ear: External ear normal.  Mouth/Throat: Oropharynx is clear and moist and mucous membranes are normal. Dental abscesses present. No oropharyngeal exudate.    Eyes: Conjunctivae are normal. Right eye exhibits no discharge. Left eye exhibits no discharge. No scleral icterus.  Neck: Neck supple. No tracheal deviation present.  Cardiovascular: Normal rate.   Pulmonary/Chest: Effort normal. No stridor. No respiratory distress.  Abdominal: He exhibits no distension.  Musculoskeletal: He exhibits no edema.  Lymphadenopathy:       Head (right side): No submandibular and no occipital adenopathy present.       Head (left side): No submandibular and no occipital adenopathy present.    He has cervical adenopathy.       Right cervical: Superficial cervical adenopathy present.       Right: No supraclavicular adenopathy present.       Left: No supraclavicular adenopathy present.  No supraclavicular  Neurological: He is alert. Cranial nerve deficit: no gross deficits.  Skin: Skin is warm and dry. No rash noted.  Psychiatric: He has a normal mood and affect.  Nursing note and vitals reviewed.    ED Treatments / Results   Procedures Procedures (including critical care time)  Medications Ordered in ED Medications  amoxicillin (AMOXIL) capsule 500 mg (not administered)  naproxen (NAPROSYN) tablet 500 mg (not administered)     Initial Impression / Assessment and Plan / ED Course  I have reviewed the triage vital signs and the nursing notes.  Pertinent labs & imaging results that were available during my care of the patient were reviewed by me and considered in my medical decision making (see chart for details).   Patient appears to have lymphadenopathy and associated with dental caries.  He is not having difficulty swallowing.  He does not have any trismus.  We will start him on a course of antibiotics.  I recommended follow-up with a dentist.  Final Clinical  Impressions(s) / ED Diagnoses   Final diagnoses:  Dental caries  Lymphadenopathy    New Prescriptions New Prescriptions   AMOXICILLIN (AMOXIL) 500 MG CAPSULE    Take 1 capsule (500 mg total) by mouth 3 (three) times daily.   NAPROXEN (NAPROSYN) 500 MG TABLET    Take 1 tablet (500 mg total) by mouth 2 (two) times daily.     Linwood DibblesKnapp, Shellie Goettl, MD 10/10/17 [phone]

## 2017-10-10 NOTE — ED Triage Notes (Signed)
Pain in the right side of neck. States he feels a lump. Pain increases with movement and swallowing. He denies sore throat.

## 2017-10-10 NOTE — Discharge Instructions (Signed)
Try to follow up with a dentist regarding your tooth, take the medications as prescribed, monitor for worsening swelling, fever

## 2018-11-19 ENCOUNTER — Emergency Department (HOSPITAL_BASED_OUTPATIENT_CLINIC_OR_DEPARTMENT_OTHER)
Admission: EM | Admit: 2018-11-19 | Discharge: 2018-11-19 | Disposition: A | Discharge status: Home / Self Care | Payer: Self-pay | Attending: Emergency Medicine | Admitting: Emergency Medicine

## 2018-11-19 ENCOUNTER — Other Ambulatory Visit: Payer: Self-pay

## 2018-11-19 ENCOUNTER — Encounter (HOSPITAL_BASED_OUTPATIENT_CLINIC_OR_DEPARTMENT_OTHER): Payer: Self-pay | Admitting: Emergency Medicine

## 2018-11-19 DIAGNOSIS — Z79899 Other long term (current) drug therapy: Secondary | ICD-10-CM | POA: Insufficient documentation

## 2018-11-19 DIAGNOSIS — F17200 Nicotine dependence, unspecified, uncomplicated: Secondary | ICD-10-CM | POA: Insufficient documentation

## 2018-11-19 DIAGNOSIS — R369 Urethral discharge, unspecified: Secondary | ICD-10-CM | POA: Insufficient documentation

## 2018-11-19 DIAGNOSIS — Z202 Contact with and (suspected) exposure to infections with a predominantly sexual mode of transmission: Secondary | ICD-10-CM | POA: Insufficient documentation

## 2018-11-19 MED ORDER — CEFTRIAXONE SODIUM 250 MG IJ SOLR
250.0000 mg | Freq: Once | INTRAMUSCULAR | Status: AC
  Administered 2018-11-19: 250 mg via INTRAMUSCULAR
  Filled 2018-11-19: qty 250

## 2018-11-19 MED ORDER — AZITHROMYCIN 250 MG PO TABS
1000.0000 mg | ORAL_TABLET | Freq: Once | ORAL | Status: AC
  Administered 2018-11-19: 1000 mg via ORAL
  Filled 2018-11-19: qty 4

## 2018-11-19 NOTE — ED Triage Notes (Signed)
Pt reports discharge from his penis that just started today. Pt reports the discharge is clear in color.

## 2018-11-19 NOTE — ED Provider Notes (Signed)
[facility] EMERGENCY DEPARTMENT Provider Note   CSN: [id number] Arrival date & time: 11/19/18  2123     History   Chief Complaint Chief Complaint  Patient presents with  . SEXUALLY TRANSMITTED DISEASE    HPI Dustin Wheeler is a 27 y.o. male presenting for evaluation of penile discharge.  Patient states he noticed clear penile discharge today.  Patient states he over the past 2 months has been sexually active with 2 females.  He states the second partner is a new partner that he does not know well.  He did not use protection.  He does not know if she has any infections.  He denies fevers, chills, nausea, vomiting, abdominal pain.  He denies penile or testicular pain or swelling.  He has no medical problems, takes no medications daily.  Denies urinary symptoms including dysuria, hematuria, urinary frequency.  HPI  History reviewed. No pertinent past medical history.  There are no active problems to display for this patient.   History reviewed. No pertinent surgical history.      Home Medications    Prior to Admission medications   Medication Sig Start Date End Date Taking? Authorizing Provider  amoxicillin (AMOXIL) 500 MG capsule Take 1 capsule (500 mg total) by mouth 3 (three) times daily. 10/10/17   Linwood DibblesKnapp, Jon, MD  metoCLOPramide (REGLAN) 10 MG tablet Take 1 tablet (10 mg total) by mouth every 6 (six) hours as needed (for nausea and/or headache). 07/23/17   Molpus, John, MD  naproxen (NAPROSYN) 500 MG tablet Take 1 tablet (500 mg total) by mouth 2 (two) times daily. 10/10/17   Linwood DibblesKnapp, Jon, MD    Family History No family history on file.  Social History Social History   Tobacco Use  . Smoking status: Current Every Day Smoker  . Smokeless tobacco: Never Used  Substance Use Topics  . Alcohol use: Yes    Comment: socially  . Drug use: Yes    Types: Marijuana     Allergies   Patient has no known allergies.   Review of Systems Review of Systems    Constitutional: Negative for fever.  Genitourinary: Positive for discharge. Negative for dysuria, frequency, penile pain, penile swelling, scrotal swelling and testicular pain.     Physical Exam Updated Vital Signs BP 130/85 (BP Location: Right Arm)   Pulse 86   Temp 98.5 F (36.9 C) (Oral)   Resp 18   Ht 6\' 2"  (1.88 m)   Wt 83.9 kg   SpO2 99%   BMI 23.75 kg/m   Physical Exam  Constitutional: He is oriented to person, place, and time. He appears well-developed and well-nourished. No distress.  HENT:  Head: Normocephalic and atraumatic.  Eyes: EOM are normal.  Neck: Normal range of motion.  Cardiovascular: Normal rate, regular rhythm and intact distal pulses.  Pulmonary/Chest: Effort normal and breath sounds normal. No respiratory distress. He has no wheezes.  Abdominal: Soft. He exhibits no distension. There is no tenderness.  Genitourinary: Testes normal and penis normal. Circumcised. No discharge found.  Genitourinary Comments: Chaperone present.  No discharge noted on exam.  No testicular or penile pain or swelling.  No inguinal lymphadenopathy.  Musculoskeletal: Normal range of motion.  Lymphadenopathy: No inguinal adenopathy noted on the right or left side.  Neurological: He is alert and oriented to person, place, and time.  Skin: Skin is warm. No rash noted.  Psychiatric: He has a normal mood and affect.  Nursing note and vitals reviewed.  ED Treatments / Results  Labs (all labs ordered are listed, but only abnormal results are displayed) Labs Reviewed  RPR  HIV ANTIBODY (ROUTINE TESTING W REFLEX)  GC/CHLAMYDIA PROBE AMP ([place]) NOT AT [facility]    EKG None  Radiology No results found.  Procedures Procedures (including critical care time)  Medications Ordered in ED Medications  cefTRIAXone (ROCEPHIN) injection 250 mg (250 mg Intramuscular Given 11/19/18 2209)  azithromycin (ZITHROMAX) tablet 1,000 mg (1,000 mg Oral Given 11/19/18 2208)      Initial Impression / Assessment and Plan / ED Course  I have reviewed the triage vital signs and the nursing notes.  Pertinent labs & imaging results that were available during my care of the patient were reviewed by me and considered in my medical decision making (see chart for details).     Pt presenting for evaluation of penile discharge.  Physical exam reassuring, he is afebrile not tachycardic.  Appears nontoxic.  No penile discharge noted on exam, but as patient is reporting symptoms, will treat for possible STD.  Gonorrhea, chlamydia, HIV, and syphilis sent.  Patient is aware of results are pending, he will be notified if they are positive.  He will follow-up with health department as needed.  Discussed abstaining from sex to prevent reinfection.  At this time, patient appears safe for discharge.  Return precautions given.  Patient states he understands and agrees to plan.   Final Clinical Impressions(s) / ED Diagnoses   Final diagnoses:  Penile discharge  Possible exposure to STD    ED Discharge Orders    None       Alveria Apley, PA-C 11/19/18 2329    Geoffery Lyons, MD 11/19/18 2337

## 2018-11-19 NOTE — ED Notes (Signed)
Patient verbalizes understanding of discharge instructions. Opportunity for questioning and answers were provided. Armband removed by staff, pt discharged from ED home with family by POV.

## 2018-11-19 NOTE — Discharge Instructions (Signed)
You were treated for gonorrhea and chlamydia today. If results are positive you do not need further treatment. Results for HIV and syphilis are pending.  If results are positive, you will receive a phone call.  If positive, you will need treatment at the health department. If results are negative, you are likely not receive a phone call.  Either way you may check online on MyChart. If you are having continued penile discharge or discomfort, follow-up with health department for further evaluation. Abstain from sex for the next 2 weeks, as this can cause reinfection. Return to the emergency room with any new, worsening, or concerning symptoms.

## 2018-11-19 NOTE — ED Notes (Signed)
Pt is here for an STD check.

## 2018-11-21 LAB — RPR: RPR Ser Ql: NONREACTIVE

## 2018-11-21 LAB — GC/CHLAMYDIA PROBE AMP (~~LOC~~) NOT AT ARMC
Chlamydia: NEGATIVE
Neisseria Gonorrhea: NEGATIVE

## 2018-11-21 LAB — HIV ANTIBODY (ROUTINE TESTING W REFLEX): HIV SCREEN 4TH GENERATION: NONREACTIVE

## 2019-01-08 ENCOUNTER — Other Ambulatory Visit: Payer: Self-pay

## 2019-01-08 ENCOUNTER — Emergency Department (HOSPITAL_BASED_OUTPATIENT_CLINIC_OR_DEPARTMENT_OTHER)
Admission: EM | Admit: 2019-01-08 | Discharge: 2019-01-08 | Disposition: A | Discharge status: Home / Self Care | Payer: Self-pay | Attending: Emergency Medicine | Admitting: Emergency Medicine

## 2019-01-08 ENCOUNTER — Encounter (HOSPITAL_BASED_OUTPATIENT_CLINIC_OR_DEPARTMENT_OTHER): Payer: Self-pay | Admitting: Emergency Medicine

## 2019-01-08 DIAGNOSIS — R05 Cough: Secondary | ICD-10-CM | POA: Insufficient documentation

## 2019-01-08 DIAGNOSIS — F1210 Cannabis abuse, uncomplicated: Secondary | ICD-10-CM | POA: Insufficient documentation

## 2019-01-08 DIAGNOSIS — J111 Influenza due to unidentified influenza virus with other respiratory manifestations: Secondary | ICD-10-CM | POA: Insufficient documentation

## 2019-01-08 DIAGNOSIS — M7918 Myalgia, other site: Secondary | ICD-10-CM | POA: Insufficient documentation

## 2019-01-08 DIAGNOSIS — R070 Pain in throat: Secondary | ICD-10-CM | POA: Insufficient documentation

## 2019-01-08 DIAGNOSIS — F17200 Nicotine dependence, unspecified, uncomplicated: Secondary | ICD-10-CM | POA: Insufficient documentation

## 2019-01-08 NOTE — ED Triage Notes (Signed)
Reports fever, congestion, and sore throat since yesterday.  States fever was 101.5 yesterday and treated at home with tylenol.  Children recently diagnosed with flu.

## 2019-01-08 NOTE — ED Provider Notes (Signed)
[facility] EMERGENCY DEPARTMENT Provider Note   CSN: [id number] Arrival date & time: 01/08/19  [id number]     History   Chief Complaint Chief Complaint  Patient presents with  . Fever    HPI Dustin Wheeler is a 28 y.o. male.  The history is provided by the patient. No language interpreter was used.  Fever   Dustin KocherRicky Wheeler is a 28 y.o. male who presents to the Emergency Department complaining of fever. He began feeling poorly yesterday with fever to 101.5. He has associated cough, scratchy throat and body aches. Note nausea, vomiting, headache, ear pain, dysuria, skin rash. He has no medical problems and takes no medications. He has taken acetaminophen at home, which does temporarily improve his fever. He has had sick contacts at home, his children have the flu. Symptoms are moderate and constant nature. History reviewed. No pertinent past medical history.  There are no active problems to display for this patient.   History reviewed. No pertinent surgical history.      Home Medications    Prior to Admission medications   Medication Sig Start Date End Date Taking? Authorizing Provider  amoxicillin (AMOXIL) 500 MG capsule Take 1 capsule (500 mg total) by mouth 3 (three) times daily. 10/10/17   Linwood DibblesKnapp, Jon, MD  metoCLOPramide (REGLAN) 10 MG tablet Take 1 tablet (10 mg total) by mouth every 6 (six) hours as needed (for nausea and/or headache). 07/23/17   Molpus, John, MD  naproxen (NAPROSYN) 500 MG tablet Take 1 tablet (500 mg total) by mouth 2 (two) times daily. 10/10/17   Linwood DibblesKnapp, Jon, MD    Family History History reviewed. No pertinent family history.  Social History Social History   Tobacco Use  . Smoking status: Current Every Day Smoker  . Smokeless tobacco: Never Used  Substance Use Topics  . Alcohol use: Yes    Comment: socially  . Drug use: Yes    Types: Marijuana     Allergies   Patient has no known allergies.   Review of Systems Review of Systems    Constitutional: Positive for fever.  All other systems reviewed and are negative.    Physical Exam Updated Vital Signs BP (!) 146/99 (BP Location: Right Arm)   Pulse 90   Temp 99.3 F (37.4 C) (Oral)   Resp 18   Ht 6\' 1"  (1.854 m)   Wt 69.9 kg   SpO2 100%   BMI 20.32 kg/m   Physical Exam Vitals signs and nursing note reviewed.  Constitutional:      Appearance: He is well-developed. He is not ill-appearing.  HENT:     Head: Normocephalic and atraumatic.     Right Ear: Tympanic membrane normal.     Left Ear: Tympanic membrane normal.     Nose: Nose normal.     Mouth/Throat:     Mouth: Mucous membranes are moist.     Pharynx: No oropharyngeal exudate or posterior oropharyngeal erythema.  Cardiovascular:     Rate and Rhythm: Normal rate and regular rhythm.     Heart sounds: No murmur.  Pulmonary:     Effort: Pulmonary effort is normal. No respiratory distress.     Breath sounds: Normal breath sounds.  Abdominal:     Palpations: Abdomen is soft.     Tenderness: There is no abdominal tenderness. There is no guarding or rebound.  Musculoskeletal:        General: No swelling or tenderness.  Skin:    General: Skin is warm  and dry.     Capillary Refill: Capillary refill takes less than 2 seconds.  Neurological:     Mental Status: He is alert and oriented to person, place, and time.  Psychiatric:        Mood and Affect: Mood normal.        Behavior: Behavior normal.      ED Treatments / Results  Labs (all labs ordered are listed, but only abnormal results are displayed) Labs Reviewed - No data to display  EKG None  Radiology No results found.  Procedures Procedures (including critical care time)  Medications Ordered in ED Medications - No data to display   Initial Impression / Assessment and Plan / ED Course  I have reviewed the triage vital signs and the nursing notes.  Pertinent labs & imaging results that were available during my care of the patient  were reviewed by me and considered in my medical decision making (see chart for details).    Patient here for evaluation of fever, cough, sore throat. He is non-toxic appearing on evaluation and in no acute distress. He is well hydrated. There is no clinical evidence of pneumonia. Discussed with patient likely viral illness, most likely influenza. Discussed supportive care at home with antipyretics and oral fluid hydration. Outpatient follow-up and return precautions discussed.  Discussed risk and benefits of Tamiflu and patient declined Tamiflu treatment.  Final Clinical Impressions(s) / ED Diagnoses   Final diagnoses:  Influenza    ED Discharge Orders    None       Tilden Fossa, MD 01/08/19 [phone]

## 2019-03-15 ENCOUNTER — Other Ambulatory Visit: Payer: Self-pay

## 2019-03-15 ENCOUNTER — Emergency Department (HOSPITAL_BASED_OUTPATIENT_CLINIC_OR_DEPARTMENT_OTHER)
Admission: EM | Admit: 2019-03-15 | Discharge: 2019-03-15 | Disposition: A | Discharge status: Home / Self Care | Payer: BLUE CROSS/BLUE SHIELD | Attending: Emergency Medicine | Admitting: Emergency Medicine

## 2019-03-15 ENCOUNTER — Encounter (HOSPITAL_BASED_OUTPATIENT_CLINIC_OR_DEPARTMENT_OTHER): Payer: Self-pay | Admitting: Student

## 2019-03-15 DIAGNOSIS — R0981 Nasal congestion: Secondary | ICD-10-CM | POA: Diagnosis present

## 2019-03-15 DIAGNOSIS — Z79899 Other long term (current) drug therapy: Secondary | ICD-10-CM | POA: Diagnosis not present

## 2019-03-15 DIAGNOSIS — J019 Acute sinusitis, unspecified: Secondary | ICD-10-CM | POA: Insufficient documentation

## 2019-03-15 DIAGNOSIS — F17200 Nicotine dependence, unspecified, uncomplicated: Secondary | ICD-10-CM | POA: Insufficient documentation

## 2019-03-15 MED ORDER — AMOXICILLIN-POT CLAVULANATE 875-125 MG PO TABS: 1.0000 | ORAL_TABLET | Freq: Two times a day (BID) | ORAL | 0 refills | Status: DC

## 2019-03-15 MED ORDER — FLUTICASONE PROPIONATE 50 MCG/ACT NA SUSP: 1.0000 | Freq: Every day | NASAL | 0 refills | Status: DC

## 2019-03-15 MED ORDER — NAPROXEN 500 MG PO TABS: 500.0000 mg | ORAL_TABLET | Freq: Two times a day (BID) | ORAL | 0 refills | Status: DC

## 2019-03-15 NOTE — Discharge Instructions (Signed)
You were seen in the emergency department and diagnosed with a sinus infection.  We are treating your infection with Augmentin an antibiotic, Flonase a nasal steroid, and naproxen an anti-inflammatory. -Augmentin- this is an antibiotic to treat the infection. Take twice per day for 7 days.  -Flonase- use 1 spray per nostril daily as decongestant daily.  -Naproxen is a nonsteroidal anti-inflammatory medication that will help with pain and swelling. Be sure to take this medication as prescribed with food, 1 pill every 12 hours,  It should be taken with food, as it can cause stomach upset, and more seriously, stomach bleeding. Do not take other nonsteroidal anti-inflammatory medications with this such as Advil, Motrin, Aleve, Mobic, Goodie Powder, or Motrin.    You make take Tylenol per over the counter dosing with these medications.   We have prescribed you new medication(s) today. Discuss the medications prescribed today with your pharmacist as they can have adverse effects and interactions with your other medicines including over the counter and prescribed medications. Seek medical evaluation if you start to experience new or abnormal symptoms after taking one of these medicines, seek care immediately if you start to experience difficulty breathing, feeling of your throat closing, facial swelling, or rash as these could be indications of a more serious allergic reaction  Follow-up with primary care within 1 week for reevaluation.  Return to the ER for new or worsening symptoms including but not limited to fever, neck stiffness, numbness, weakness, dizziness, passing out, worsening of headache, or any other concerns.

## 2019-03-15 NOTE — ED Triage Notes (Signed)
Reports nasal congestion and sinus pain without fevers for 1 month.

## 2019-03-15 NOTE — ED Provider Notes (Signed)
[facility] EMERGENCY DEPARTMENT Provider Note   CSN: [id number] Arrival date & time: 03/15/19  1336    History   Chief Complaint Chief Complaint  Patient presents with  . Facial Pain    HPI Dustin Wheeler is a 28 y.o. male with a hx of tobacco abuse who presents to the ED with complaints of nasal congestion & sinus pain which has been progressively worsening for the past 1 month. Patient notes congestion & rhinorrhea primarily to the L nare w/ associated L maxillary/frontal sinus pain & headaches (gradual onset, steady progression). Sxs are progressively worsening. Currently pain is a 5/10 in severity. No alleviating/aggravating factors. Trying tylenol/OTC decongestants without much change. Denies fever, chills, emesis, neck stiffness, ear pain, sore throat, cough, dyspnea, visual disturbance, numbness, weakness, dizziness, or recent head injury. Hx of similar facial pain/headaches & nasal sxs w/ sinusitis. No travel or known covid 19 exposure.     HPI  History reviewed. No pertinent past medical history.  There are no active problems to display for this patient.   History reviewed. No pertinent surgical history.      Home Medications    Prior to Admission medications   Medication Sig Start Date End Date Taking? Authorizing Provider  amoxicillin (AMOXIL) 500 MG capsule Take 1 capsule (500 mg total) by mouth 3 (three) times daily. 10/10/17   Linwood Dibbles, MD  metoCLOPramide (REGLAN) 10 MG tablet Take 1 tablet (10 mg total) by mouth every 6 (six) hours as needed (for nausea and/or headache). 07/23/17   Molpus, John, MD  naproxen (NAPROSYN) 500 MG tablet Take 1 tablet (500 mg total) by mouth 2 (two) times daily. 10/10/17   Linwood Dibbles, MD    Family History History reviewed. No pertinent family history.  Social History Social History   Tobacco Use  . Smoking status: Current Every Day Smoker    Packs/day: 0.50  . Smokeless tobacco: Never Used  Substance Use Topics  .  Alcohol use: Yes    Comment: socially  . Drug use: Yes    Types: Marijuana     Allergies   Patient has no known allergies.   Review of Systems Review of Systems  Constitutional: Negative for chills and fever.  HENT: Positive for congestion, rhinorrhea, sinus pressure and sinus pain. Negative for ear pain and sore throat.   Eyes: Negative for visual disturbance.  Respiratory: Negative for cough and shortness of breath.   Cardiovascular: Negative for chest pain.  Gastrointestinal: Negative for abdominal pain and vomiting.  Musculoskeletal: Negative for neck pain and neck stiffness.  Neurological: Positive for headaches. Negative for syncope, weakness and numbness.    Physical Exam Updated Vital Signs BP (!) 143/89 (BP Location: Left Arm)   Pulse (!) 56   Temp 98.6 F (37 C) (Oral)   Resp 18   Ht 6\' 1"  (1.854 m)   Wt 76.2 kg   SpO2 100%   BMI 22.16 kg/m   Physical Exam Vitals signs and nursing note reviewed.  Constitutional:      General: He is not in acute distress.    Appearance: He is well-developed. He is not toxic-appearing.  HENT:     Head: Normocephalic and atraumatic.     Right Ear: Tympanic membrane, ear canal and external ear normal.     Left Ear: Tympanic membrane, ear canal and external ear normal.     Ears:     Comments: No mastoid erythema/swelling/tenderness.     Nose: Mucosal edema and congestion present.  Right Sinus: No maxillary sinus tenderness or frontal sinus tenderness.     Left Sinus: Maxillary sinus tenderness and frontal sinus tenderness present.     Comments: Boggy swollen turbinates to L nare > R nare.     Mouth/Throat:     Mouth: Mucous membranes are moist.     Pharynx: Oropharynx is clear. Uvula midline. No oropharyngeal exudate or posterior oropharyngeal erythema.     Comments: Posterior oropharynx is symmetric appearing. Patient tolerating own secretions without difficulty. No trismus. No drooling. No hot potato voice. No swelling  beneath the tongue, submandibular compartment is soft.  Eyes:     General:        Right eye: No discharge.        Left eye: No discharge.     Conjunctiva/sclera: Conjunctivae normal.     Comments: No proptosis.   Neck:     Musculoskeletal: Normal range of motion and neck supple. No neck rigidity.  Cardiovascular:     Rate and Rhythm: Regular rhythm. Bradycardia present.  Pulmonary:     Effort: Pulmonary effort is normal. No respiratory distress.     Breath sounds: Normal breath sounds. No wheezing, rhonchi or rales.  Abdominal:     General: There is no distension.     Palpations: Abdomen is soft.     Tenderness: There is no abdominal tenderness.  Lymphadenopathy:     Cervical: No cervical adenopathy.  Skin:    General: Skin is warm and dry.     Findings: No rash.  Neurological:     Mental Status: He is alert.     Comments: Clear speech.  CN III through XII grossly intact.  Station grossly intact bilateral upper and lower extremities.  5 out of 5 symmetric grip strength.  5 out of 5 strength with plantar dorsiflexion bilaterally.  Normal finger-to-nose.  Negative pronator drift.  Ambulatory with steady gait.  Psychiatric:        Behavior: Behavior normal.    ED Treatments / Results  Labs (all labs ordered are listed, but only abnormal results are displayed) Labs Reviewed - No data to display  EKG None  Radiology No results found.  Procedures Procedures (including critical care time)  Medications Ordered in ED Medications - No data to display   Initial Impression / Assessment and Plan / ED Course  I have reviewed the triage vital signs and the nursing notes.  Pertinent labs & imaging results that were available during my care of the patient were reviewed by me and considered in my medical decision making (see chart for details).   Patient presents to the ED w/ progressively worsening L sided nasal congestin/rhinorrhea & sinus pain/pressure. Nontoxic appearing, BP  elevated doubt HTN emergency, PCP recheck. Patient has hx of similar headaches w/ sinusitis, gradual onset with steady progression in severity- non concerning for [facility], ICH, ischemic CVA, dural venous sinus thrombosis, acute glaucoma, giant cell arteritis, mass, or meningitis. Pt is afebrile with no focal neuro deficits, dizziness, change in vision, proptosis, or nuchal rigidity. Suspect sxs are related to sinusitis, given 1 month of worsening symptoms feel it is reasonable to cover for bacterial sinusitis w/ augmentin, additional supportive care w/ flonase & naproxen.  I discussed treatment plan, need for PCP follow-up, and return precautions with the patient. Provided opportunity for questions, patient confirmed understanding and is in agreement with plan.    Final Clinical Impressions(s) / ED Diagnoses   Final diagnoses:  Acute sinusitis, recurrence not specified, unspecified location  ED Discharge Orders         Ordered    amoxicillin-clavulanate (AUGMENTIN) 875-125 MG tablet  Every 12 hours     03/15/19 1357    fluticasone (FLONASE) 50 MCG/ACT nasal spray  Daily     03/15/19 1357    naproxen (NAPROSYN) 500 MG tablet  2 times daily     03/15/19 1357           Sharlot Sturkey, [place] R, PA-C 03/15/19 1415    Melene Plan, DO 03/15/19 1441

## 2019-10-08 ENCOUNTER — Encounter (HOSPITAL_BASED_OUTPATIENT_CLINIC_OR_DEPARTMENT_OTHER): Payer: Self-pay | Admitting: *Deleted

## 2019-10-08 ENCOUNTER — Other Ambulatory Visit: Payer: Self-pay

## 2019-10-08 ENCOUNTER — Emergency Department (HOSPITAL_BASED_OUTPATIENT_CLINIC_OR_DEPARTMENT_OTHER)
Admission: EM | Admit: 2019-10-08 | Discharge: 2019-10-08 | Disposition: A | Discharge status: Home / Self Care | Payer: BLUE CROSS/BLUE SHIELD | Attending: Emergency Medicine | Admitting: Emergency Medicine

## 2019-10-08 DIAGNOSIS — F17210 Nicotine dependence, cigarettes, uncomplicated: Secondary | ICD-10-CM | POA: Diagnosis not present

## 2019-10-08 DIAGNOSIS — F1210 Cannabis abuse, uncomplicated: Secondary | ICD-10-CM | POA: Diagnosis not present

## 2019-10-08 DIAGNOSIS — Z202 Contact with and (suspected) exposure to infections with a predominantly sexual mode of transmission: Secondary | ICD-10-CM | POA: Insufficient documentation

## 2019-10-08 DIAGNOSIS — Z711 Person with feared health complaint in whom no diagnosis is made: Secondary | ICD-10-CM

## 2019-10-08 LAB — URINALYSIS, ROUTINE W REFLEX MICROSCOPIC
Bilirubin Urine: NEGATIVE
Glucose, UA: NEGATIVE mg/dL
Hgb urine dipstick: NEGATIVE
Ketones, ur: 15 mg/dL — AB
Leukocytes,Ua: NEGATIVE
Nitrite: NEGATIVE
Protein, ur: NEGATIVE mg/dL
Specific Gravity, Urine: >1.03 — ABNORMAL HIGH (ref 1.005–1.030)
pH: 5.5 (ref 5.0–8.0)

## 2019-10-08 LAB — HIV ANTIBODY (ROUTINE TESTING W REFLEX): HIV Screen 4th Generation wRfx: NONREACTIVE

## 2019-10-08 MED ORDER — AZITHROMYCIN 250 MG PO TABS
1000.0000 mg | ORAL_TABLET | Freq: Once | ORAL | Status: AC
  Administered 2019-10-08: 1000 mg via ORAL
  Filled 2019-10-08: qty 4

## 2019-10-08 MED ORDER — CEFTRIAXONE SODIUM 250 MG IJ SOLR
250.0000 mg | Freq: Once | INTRAMUSCULAR | Status: AC
  Administered 2019-10-08: 250 mg via INTRAMUSCULAR
  Filled 2019-10-08: qty 250

## 2019-10-08 MED ORDER — LIDOCAINE HCL (PF) 1 % IJ SOLN
INTRAMUSCULAR | Status: AC
  Administered 2019-10-08: 15:00:00 5 mL
  Filled 2019-10-08: qty 5

## 2019-10-08 NOTE — Discharge Instructions (Addendum)
We will contact you with the results of your remaining lab work when it is available. °

## 2019-10-08 NOTE — ED Notes (Signed)
Pt. Seen by PA C for STD check and assessed completely by [facility]

## 2019-10-08 NOTE — ED Triage Notes (Signed)
Possible STD exposure.

## 2019-10-08 NOTE — ED Provider Notes (Signed)
[facility] EMERGENCY DEPARTMENT Provider Note   CSN: [id number] Arrival date & time: 10/08/19  1249     History   Chief Complaint No chief complaint on file.   HPI Dustin Wheeler is a 28 y.o. male who presents to ED for concern for STD.  States that he had intercourse with a new male partner last night.  She told him that she had a "bump" in her genital area.  Patient was unable to visualize it.  He is concerned that he may have been exposed to an STD.  He denies any symptoms at this time including discharge, dysuria, rashes or lesions, abdominal pain or hematuria.     HPI  History reviewed. No pertinent past medical history.  There are no active problems to display for this patient.   History reviewed. No pertinent surgical history.      Home Medications    Prior to Admission medications   Medication Sig Start Date End Date Taking? Authorizing Provider  amoxicillin-clavulanate (AUGMENTIN) 875-125 MG tablet Take 1 tablet by mouth every 12 (twelve) hours. 03/15/19   Petrucelli, Samantha R, PA-C  fluticasone (FLONASE) 50 MCG/ACT nasal spray Place 1 spray into both nostrils daily. 03/15/19   Petrucelli, Samantha R, PA-C  naproxen (NAPROSYN) 500 MG tablet Take 1 tablet (500 mg total) by mouth 2 (two) times daily. 03/15/19   Petrucelli, Glynda Jaeger, PA-C    Family History No family history on file.  Social History Social History   Tobacco Use  . Smoking status: Current Every Day Smoker    Packs/day: 0.50  . Smokeless tobacco: Never Used  Substance Use Topics  . Alcohol use: Yes    Comment: socially  . Drug use: Yes    Types: Marijuana     Allergies   Patient has no known allergies.   Review of Systems Review of Systems  Constitutional: Negative for chills and fever.  Gastrointestinal: Negative for abdominal pain.  Genitourinary: Negative for discharge, genital sores, hematuria, penile pain and penile swelling.     Physical Exam Updated Vital  Signs BP (!) 152/102   Pulse 93   Temp 98.4 F (36.9 C) (Oral)   Resp 20   Ht 6\' 2"  (1.88 m)   Wt 81.6 kg   SpO2 100%   BMI 23.11 kg/m   Physical Exam Vitals signs and nursing note reviewed.  Constitutional:      General: He is not in acute distress.    Appearance: He is well-developed. He is not diaphoretic.  HENT:     Head: Normocephalic and atraumatic.  Eyes:     General: No scleral icterus.    Conjunctiva/sclera: Conjunctivae normal.  Neck:     Musculoskeletal: Normal range of motion.  Pulmonary:     Effort: Pulmonary effort is normal. No respiratory distress.  Genitourinary:    Comments: Patient declined. Skin:    Findings: No rash.  Neurological:     Mental Status: He is alert.      ED Treatments / Results  Labs (all labs ordered are listed, but only abnormal results are displayed) Labs Reviewed  URINALYSIS, ROUTINE W REFLEX MICROSCOPIC - Abnormal; Notable for the following components:      Result Value   Color, Urine AMBER (*)    Specific Gravity, Urine >1.030 (*)    Ketones, ur 15 (*)    All other components within normal limits  RPR  HIV ANTIBODY (ROUTINE TESTING W REFLEX)  GC/CHLAMYDIA PROBE AMP ([place]) NOT AT  [facility]    EKG None  Radiology No results found.  Procedures Procedures (including critical care time)  Medications Ordered in ED Medications  cefTRIAXone (ROCEPHIN) injection 250 mg (has no administration in time range)  azithromycin (ZITHROMAX) tablet 1,000 mg (has no administration in time range)     Initial Impression / Assessment and Plan / ED Course  I have reviewed the triage vital signs and the nursing notes.  Pertinent labs & imaging results that were available during my care of the patient were reviewed by me and considered in my medical decision making (see chart for details).        28 year old male presents to ED for concern for STD exposure.  Has sexual intercourse with a male partner last night and is  concerned that she had a "bump" in her genital area.  No symptoms at this time and he declined a genital exam.  GC chlamydia probe, HIV and RPR pending.  Will treat prophylactically for gonorrhea and chlamydia and have him follow-up with results.  Urinalysis unremarkable with the exception of signs dehydration so patient encouraged to increase fluid intake.  Patient is hemodynamically stable, in NAD, and able to ambulate in the ED. Evaluation does not show pathology that would require ongoing emergent intervention or inpatient treatment. I explained the diagnosis to the patient. Pain has been managed and has no complaints prior to discharge. Patient is comfortable with above plan and is stable for discharge at this time. All questions were answered prior to disposition. Strict return precautions for returning to the ED were discussed. Encouraged follow up with PCP.   An After Visit Summary was printed and given to the patient.   Portions of this note were generated with Scientist, clinical (histocompatibility and immunogenetics). Dictation errors may occur despite best attempts at proofreading.   Final Clinical Impressions(s) / ED Diagnoses   Final diagnoses:  Concern about STD in male without diagnosis    ED Discharge Orders    None       Dietrich Pates, PA-C 10/08/19 1446    Arby Barrette, MD 10/10/19 [phone]

## 2019-10-09 LAB — RPR: RPR Ser Ql: NONREACTIVE

## 2020-06-05 ENCOUNTER — Encounter (HOSPITAL_BASED_OUTPATIENT_CLINIC_OR_DEPARTMENT_OTHER): Payer: Self-pay | Admitting: Emergency Medicine

## 2020-06-05 ENCOUNTER — Emergency Department (HOSPITAL_BASED_OUTPATIENT_CLINIC_OR_DEPARTMENT_OTHER): Payer: Self-pay

## 2020-06-05 ENCOUNTER — Other Ambulatory Visit: Payer: Self-pay

## 2020-06-05 ENCOUNTER — Emergency Department (HOSPITAL_BASED_OUTPATIENT_CLINIC_OR_DEPARTMENT_OTHER)
Admission: EM | Admit: 2020-06-05 | Discharge: 2020-06-05 | Disposition: A | Discharge status: Home / Self Care | Payer: Self-pay | Attending: Emergency Medicine | Admitting: Emergency Medicine

## 2020-06-05 DIAGNOSIS — K2900 Acute gastritis without bleeding: Secondary | ICD-10-CM | POA: Insufficient documentation

## 2020-06-05 DIAGNOSIS — F17200 Nicotine dependence, unspecified, uncomplicated: Secondary | ICD-10-CM | POA: Insufficient documentation

## 2020-06-05 LAB — COMPREHENSIVE METABOLIC PANEL
ALT: 15 U/L (ref 0–44)
AST: 19 U/L (ref 15–41)
Albumin: 4.5 g/dL (ref 3.5–5.0)
Alkaline Phosphatase: 52 U/L (ref 38–126)
Anion gap: 12 (ref 5–15)
BUN: 11 mg/dL (ref 6–20)
CO2: 23 mmol/L (ref 22–32)
Calcium: 9.2 mg/dL (ref 8.9–10.3)
Chloride: 101 mmol/L (ref 98–111)
Creatinine, Ser: 0.91 mg/dL (ref 0.61–1.24)
GFR calc Af Amer: >60 mL/min (ref >60)
GFR calc non Af Amer: >60 mL/min (ref >60)
Glucose, Bld: 90 mg/dL (ref 70–99)
Potassium: 4.1 mmol/L (ref 3.5–5.1)
Sodium: 136 mmol/L (ref 135–145)
Total Bilirubin: 1.8 mg/dL — ABNORMAL HIGH (ref 0.3–1.2)
Total Protein: 7.6 g/dL (ref 6.5–8.1)

## 2020-06-05 LAB — URINALYSIS, ROUTINE W REFLEX MICROSCOPIC
Bilirubin Urine: NEGATIVE
Glucose, UA: NEGATIVE mg/dL
Hgb urine dipstick: NEGATIVE
Ketones, ur: NEGATIVE mg/dL
Leukocytes,Ua: NEGATIVE
Nitrite: NEGATIVE
Protein, ur: NEGATIVE mg/dL
Specific Gravity, Urine: 1.02 (ref 1.005–1.030)
pH: 7 (ref 5.0–8.0)

## 2020-06-05 LAB — CBC
HCT: 49.6 % (ref 39.0–52.0)
Hemoglobin: 16.7 g/dL (ref 13.0–17.0)
MCH: 28.7 pg (ref 26.0–34.0)
MCHC: 33.7 g/dL (ref 30.0–36.0)
MCV: 85.4 fL (ref 80.0–100.0)
Platelets: 189 10*3/uL (ref 150–400)
RBC: 5.81 MIL/uL (ref 4.22–5.81)
RDW: 12.9 % (ref 11.5–15.5)
WBC: 7.9 10*3/uL (ref 4.0–10.5)
nRBC: 0 % (ref 0.0–0.2)

## 2020-06-05 LAB — LIPASE, BLOOD: Lipase: 21 U/L (ref 11–51)

## 2020-06-05 MED ORDER — IOHEXOL 300 MG/ML  SOLN
100.0000 mL | Freq: Once | INTRAMUSCULAR | Status: AC | PRN
  Administered 2020-06-05: 100 mL via INTRAVENOUS

## 2020-06-05 MED ORDER — PANTOPRAZOLE SODIUM 20 MG PO TBEC: 40.0000 mg | DELAYED_RELEASE_TABLET | Freq: Every day | ORAL | 0 refills | Status: DC

## 2020-06-05 MED ORDER — DICYCLOMINE HCL 20 MG PO TABS: 20.0000 mg | ORAL_TABLET | Freq: Two times a day (BID) | ORAL | 0 refills | Status: DC

## 2020-06-05 MED ORDER — SODIUM CHLORIDE 0.9 % IV BOLUS
1000.0000 mL | Freq: Once | INTRAVENOUS | Status: AC
  Administered 2020-06-05: 1000 mL via INTRAVENOUS

## 2020-06-05 MED ORDER — ONDANSETRON 4 MG PO TBDP: 4.0000 mg | ORAL_TABLET | Freq: Three times a day (TID) | ORAL | 0 refills | Status: DC | PRN

## 2020-06-05 NOTE — ED Triage Notes (Signed)
Upper abd pain x 3 days. States it feels like acid reflux. Endorses vomiting today.

## 2020-06-05 NOTE — ED Provider Notes (Signed)
[facility] EMERGENCY DEPARTMENT Provider Note   CSN: [id number] Arrival date & time: 06/05/20  1208     History Chief Complaint  Patient presents with  . Abdominal Pain    Dustin Wheeler is a 29 y.o. male.  HPI      Went to work this AM and began to have abdominal pain Top of abdomen and very bottom, moves around, now is 5/10, took tums before coming, was 8/10 before Pressure then feels nausea Nausea, vomiting x1-2 as leaving work No diarrhea No fevers or chills 2 weeks low appetite, not wanting to eat much, nausea with eating No urinary symptoms, having change in BM, smaller BM but having every day No sick contacts, travel, suspicious foods Daughter's fifth birthday yesterday, did have some etoh, no smoking cigarettes   History reviewed. No pertinent past medical history.  There are no problems to display for this patient.   History reviewed. No pertinent surgical history.     No family history on file.  Social History   Tobacco Use  . Smoking status: Current Every Day Smoker    Packs/day: 0.50  . Smokeless tobacco: Never Used  Substance Use Topics  . Alcohol use: Yes    Comment: socially  . Drug use: Yes    Types: Marijuana    Comment: daily    Home Medications Prior to Admission medications   Medication Sig Start Date End Date Taking? Authorizing Provider  amoxicillin-clavulanate (AUGMENTIN) 875-125 MG tablet Take 1 tablet by mouth every 12 (twelve) hours. 03/15/19   Petrucelli, Samantha R, PA-C  dicyclomine (BENTYL) 20 MG tablet Take 1 tablet (20 mg total) by mouth 2 (two) times daily. Abdominal pain 06/05/20   Gareth Morgan, MD  fluticasone (FLONASE) 50 MCG/ACT nasal spray Place 1 spray into both nostrils daily. 03/15/19   Petrucelli, Samantha R, PA-C  naproxen (NAPROSYN) 500 MG tablet Take 1 tablet (500 mg total) by mouth 2 (two) times daily. 03/15/19   Petrucelli, Samantha R, PA-C  ondansetron (ZOFRAN ODT) 4 MG disintegrating tablet Take 1  tablet (4 mg total) by mouth every 8 (eight) hours as needed for nausea or vomiting. 06/05/20   Gareth Morgan, MD  pantoprazole (PROTONIX) 20 MG tablet Take 2 tablets (40 mg total) by mouth daily. 06/05/20   Gareth Morgan, MD    Allergies    Patient has no known allergies.  Review of Systems   Review of Systems  Constitutional: Positive for appetite change. Negative for fever.  HENT: Negative for sore throat.   Eyes: Negative for visual disturbance.  Respiratory: Negative for shortness of breath.   Cardiovascular: Negative for chest pain.  Gastrointestinal: Positive for abdominal pain, nausea and vomiting. Negative for constipation and diarrhea.  Genitourinary: Negative for difficulty urinating and dysuria.  Musculoskeletal: Negative for back pain and neck stiffness.  Skin: Negative for rash.  Neurological: Negative for syncope and headaches.    Physical Exam Updated Vital Signs BP 134/75 (BP Location: Left Arm)   Pulse (!) 58   Temp 98 F (36.7 C) (Oral)   Resp 16   Ht 6\' 2"  (1.88 m)   Wt 78.9 kg   SpO2 100%   BMI 22.33 kg/m   Physical Exam Vitals and nursing note reviewed.  Constitutional:      General: He is not in acute distress.    Appearance: Normal appearance. He is not ill-appearing, toxic-appearing or diaphoretic.  HENT:     Head: Normocephalic.  Eyes:     Conjunctiva/sclera: Conjunctivae  normal.  Cardiovascular:     Rate and Rhythm: Normal rate and regular rhythm.     Pulses: Normal pulses.  Pulmonary:     Effort: Pulmonary effort is normal. No respiratory distress.  Abdominal:     Tenderness: There is abdominal tenderness in the right lower quadrant and epigastric area.  Musculoskeletal:        General: No deformity or signs of injury.     Cervical back: No rigidity.  Skin:    General: Skin is warm and dry.     Coloration: Skin is not jaundiced or pale.  Neurological:     General: No focal deficit present.     Mental Status: He is alert and  oriented to person, place, and time.     ED Results / Procedures / Treatments   Labs (all labs ordered are listed, but only abnormal results are displayed) Labs Reviewed  COMPREHENSIVE METABOLIC PANEL - Abnormal; Notable for the following components:      Result Value   Total Bilirubin 1.8 (*)    All other components within normal limits  LIPASE, BLOOD  CBC  URINALYSIS, ROUTINE W REFLEX MICROSCOPIC    EKG None  Radiology CT ABDOMEN PELVIS W CONTRAST  Result Date: 06/05/2020 CLINICAL DATA:  Acute right lower quadrant abdominal pain. EXAM: CT ABDOMEN AND PELVIS WITH CONTRAST TECHNIQUE: Multidetector CT imaging of the abdomen and pelvis was performed using the standard protocol following bolus administration of intravenous contrast. CONTRAST:  OMNIPAQUE IOHEXOL 300 MG/ML  SOLN COMPARISON:  January 19, 2016. FINDINGS: Lower chest: No acute abnormality. Hepatobiliary: No focal liver abnormality is seen. No gallstones, gallbladder wall thickening, or biliary dilatation. Pancreas: Unremarkable. No pancreatic ductal dilatation or surrounding inflammatory changes. Spleen: Normal in size without focal abnormality. Adrenals/Urinary Tract: Adrenal glands are unremarkable. Kidneys are normal, without renal calculi, focal lesion, or hydronephrosis. Bladder is unremarkable. Stomach/Bowel: Stomach is within normal limits. Appendix appears normal. No evidence of bowel wall thickening, distention, or inflammatory changes. Vascular/Lymphatic: No significant vascular findings are present. No enlarged abdominal or pelvic lymph nodes. Reproductive: Prostate is unremarkable. Other: No abdominal wall hernia or abnormality. No abdominopelvic ascites. Musculoskeletal: No acute or significant osseous findings. IMPRESSION: No abnormality seen in the abdomen or pelvis. Electronically Signed   By: Lupita Raider M.D.   On: 06/05/2020 15:17    Procedures Procedures (including critical care time)  Medications  Ordered in ED Medications  sodium chloride 0.9 % bolus 1,000 mL (0 mLs Intravenous Stopped 06/05/20 1530)  iohexol (OMNIPAQUE) 300 MG/ML solution 100 mL (100 mLs Intravenous Contrast Given 06/05/20 1441)    ED Course  I have reviewed the triage vital signs and the nursing notes.  Pertinent labs & imaging results that were available during my care of the patient were reviewed by me and considered in my medical decision making (see chart for details).    MDM Rules/Calculators/A&P                          29yo male presents with concern for nausea, vomiting and abdominal pain.  CT ordered to evaluate for appendicitis given RLQ tenderness on exam and shows no evidence of acute abnormalities. No signs of pancreatitis, hepatitis or other acute abnormalities n CT or lab work.  Suspect likely gastritis/gastroenteritis secondary to viral etiology or possible etoh use yesterday.  Given rx for zofran, PPI, bentyl and recommend PCP follow up.    Final Clinical Impression(s) / ED  Diagnoses Final diagnoses:  Acute gastritis without hemorrhage, unspecified gastritis type    Rx / DC Orders ED Discharge Orders         Ordered    ondansetron (ZOFRAN ODT) 4 MG disintegrating tablet  Every 8 hours PRN     Discontinue  Reprint     06/05/20 1523    pantoprazole (PROTONIX) 20 MG tablet  Daily     Discontinue  Reprint     06/05/20 1523    dicyclomine (BENTYL) 20 MG tablet  2 times daily     Discontinue  Reprint     06/05/20 1523           Alvira Monday, MD 06/05/20 2131

## 2021-04-30 ENCOUNTER — Encounter (HOSPITAL_BASED_OUTPATIENT_CLINIC_OR_DEPARTMENT_OTHER): Payer: Self-pay | Admitting: *Deleted

## 2021-04-30 ENCOUNTER — Emergency Department (HOSPITAL_BASED_OUTPATIENT_CLINIC_OR_DEPARTMENT_OTHER)
Admission: EM | Admit: 2021-04-30 | Discharge: 2021-04-30 | Disposition: A | Discharge status: Home / Self Care | Payer: Self-pay | Attending: Emergency Medicine | Admitting: Emergency Medicine

## 2021-04-30 ENCOUNTER — Other Ambulatory Visit: Payer: Self-pay

## 2021-04-30 DIAGNOSIS — H18891 Other specified disorders of cornea, right eye: Secondary | ICD-10-CM

## 2021-04-30 DIAGNOSIS — T1501XA Foreign body in cornea, right eye, initial encounter: Secondary | ICD-10-CM | POA: Insufficient documentation

## 2021-04-30 DIAGNOSIS — X58XXXA Exposure to other specified factors, initial encounter: Secondary | ICD-10-CM | POA: Insufficient documentation

## 2021-04-30 DIAGNOSIS — F17210 Nicotine dependence, cigarettes, uncomplicated: Secondary | ICD-10-CM | POA: Insufficient documentation

## 2021-04-30 MED ORDER — FLUORESCEIN SODIUM 1 MG OP STRP
1.0000 | ORAL_STRIP | Freq: Once | OPHTHALMIC | Status: AC
  Administered 2021-04-30: 1 via OPHTHALMIC
  Filled 2021-04-30: qty 1

## 2021-04-30 MED ORDER — TETRACAINE HCL 0.5 % OP SOLN
2.0000 [drp] | Freq: Once | OPHTHALMIC | Status: AC
  Administered 2021-04-30: 2 [drp] via OPHTHALMIC
  Filled 2021-04-30: qty 4

## 2021-04-30 MED ORDER — ERYTHROMYCIN 5 MG/GM OP OINT
TOPICAL_OINTMENT | Freq: Once | OPHTHALMIC | Status: AC
  Administered 2021-04-30: 1 via OPHTHALMIC
  Filled 2021-04-30: qty 3.5

## 2021-04-30 NOTE — Discharge Instructions (Signed)
Please read and follow all provided instructions.  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 Corneal foreign body with residual material, right, initial encounter     Tests performed today include: Visual acuity testing to check your vision Fluorescein dye examination to look for scratches on your eye Vital signs. See below for your results today.   Medications prescribed:  Erythromycin  - antibiotic eye ointment  Use this medication as follows: Apply 1/4" of the antibiotic ointment to affected eye up to 6 times a day while awake for 7 days  Take any prescribed medications only as directed.  Home care instructions:  Follow any educational materials contained in this packet.  You have a scratch of the eye on the cornea (the clear part of the eye). This condition may be caused by trauma. It is a common problem for people who wear contact lenses. Proper treatment is important. No evidence of infection is noted today, but you could develop an infection called a corneal ulcer or have some retained foreign body that may or may not have been noted today in the Emergency Department. Ulcers are not only painful, but they may also scar the cornea and cause permanent damage to the eye.   If you wear contact lenses, do not use them until your eye caregiver approves. Follow-up care is necessary to be sure the corneal abrasion is healing if not completely resolved in 2-3 days. See your caregiver or eye specialist as suggested for followup.   Follow-up instructions: Please follow-up with the opthalmologist tomorrow morning at 8 am for further evaluation of your symptoms.   Return instructions:  Please return to the Emergency Department if you experience worsening symptoms.  Please return immediately if you develop severe pain, pus drainage, new change in vision, or fever. Please return if you have any other emergent concerns.  Additional Information:  Your vital  signs today were: BP (!) 136/97 (BP Location: Left Arm)   Pulse 68   Temp 98.4 F (36.9 C) (Oral)   Resp 18   Ht 6\' 2"  (1.88 m)   Wt 81.6 kg   SpO2 99%   BMI 23.11 kg/m  If your blood pressure (BP) was elevated above 135/85 this visit, please have this repeated by your doctor within one month.

## 2021-04-30 NOTE — ED Provider Notes (Addendum)
[facility] EMERGENCY DEPARTMENT Provider Note   CSN: [id number] Arrival date & time: 04/30/21  1437     History Chief Complaint  Patient presents with  . Eye Pain    Dustin Wheeler is a 30 y.o. male.  Patient presents to the emergency department for evaluation of right eye irritation and pain.  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  He thinks that something went into his eye.  He rubbed his eye but it did not bother him very much.  Last night he used eyedrops.  Today the eye is more irritated prompting emergency department visit.  He does not use glasses or contact lenses.  He denies other injuries.  No blurry or loss of vision.  Onset of symptoms acute.  Course is constant.        History reviewed. No pertinent past medical history.  There are no problems to display for this patient.   History reviewed. No pertinent surgical history.     History reviewed. No pertinent family history.  Social History   Tobacco Use  . Smoking status: Current Every Day Smoker    Packs/day: 0.50  . Smokeless tobacco: Never Used  Substance Use Topics  . Alcohol use: Yes    Comment: socially  . Drug use: Not Currently    Types: Marijuana    Comment: daily    Home Medications Prior to Admission medications   Not on File    Allergies    Patient has no known allergies.  Review of Systems   Review of Systems  Eyes: Positive for photophobia (mild), pain and redness. Negative for discharge, itching and visual disturbance.  Neurological: Negative for headaches.    Physical Exam Updated Vital Signs BP (!) 136/97 (BP Location: Left Arm)   Pulse 68   Temp 98.4 F (36.9 C) (Oral)   Resp 18   Ht 6\' 2"  (1.88 m)   Wt 81.6 kg   SpO2 99%   BMI 23.11 kg/m   Physical Exam Vitals and nursing note reviewed.  Constitutional:      Appearance: He is well-developed.  HENT:     Head: Normocephalic and atraumatic.  Eyes:      General: Lids are normal. Vision grossly intact.        Right eye: Foreign body present.        Left eye: No foreign body.     Conjunctiva/sclera:     Right eye: Right conjunctiva is injected (mild). No chemosis.    Left eye: Left conjunctiva is not injected. No chemosis.    Comments: Small suspected metallic FB in R cornea, upper outer quadrant.   Pulmonary:     Effort: No respiratory distress.  Musculoskeletal:     Cervical back: Normal range of motion and neck supple.  Skin:    General: Skin is warm and dry.  Neurological:     Mental Status: He is alert.     ED Results / Procedures / Treatments   Labs (all labs ordered are listed, but only abnormal results are displayed) Labs Reviewed - No data to display  EKG None  Radiology No results found.  Procedures .Foreign Body Removal  Date/Time: 04/30/2021 3:43 PM Performed by: 06/30/2021, PA-C Authorized by: Renne Crigler, PA-C  Consent: Verbal consent obtained. Consent given by: patient Patient identity confirmed: verbally with patient Body area: eye Location details: right cornea  Sedation: Patient sedated: no  Localization method: slit lamp and  magnification Removal mechanism: 29g needle. Eye examined with fluorescein. No fluorescein uptake. Residual rust ring present. Dressing: antibiotic ointment Depth: superficial Complexity: simple Post-procedure assessment: foreign body removed Comments: Small metallic FB.      Medications Ordered in ED Medications  fluorescein ophthalmic strip 1 strip (1 strip Left Eye Given 04/30/21 1542)  tetracaine (PONTOCAINE) 0.5 % ophthalmic solution 2 drop (2 drops Left Eye Given 04/30/21 1542)  erythromycin ophthalmic ointment (1 application Right Eye Given 04/30/21 1542)    ED Course  I have reviewed the triage vital signs and the nursing notes.  Pertinent labs & imaging results that were available during my care of the patient were reviewed by me and considered in my  medical decision making (see chart for details).  Patient seen and examined.  I evaluated using bedside slit-lamp.  Vital signs reviewed and are as follows: BP (!) 136/97 (BP Location: Left Arm)   Pulse 68   Temp 98.4 F (36.9 C) (Oral)   Resp 18   Ht 6\' 2"  (1.88 m)   Wt 81.6 kg   SpO2 99%   BMI 23.11 kg/m   Discussed attempt at foreign body removal with 29-gauge insulin needle.  Patient agrees to proceed.  Small foreign body was broken up and then patient was able to expel it from his eye with blinking.  There is a small residual rust ring.  Will touch base with ophthalmology.  3:50 PM spoke with Dr. .  He is agreeable to follow-up with the patient in the morning.  Requests patient arrive at 8 AM.  Request that he be discharged home with erythromycin ointment.  Patient updated and agrees with plan.    MDM Rules/Calculators/A&P                          Patient with small metallic right eye corneal foreign body, status post removal however there is residual rust ring.  Follow-up plan as above.   Final Clinical Impression(s) / ED Diagnoses Final diagnoses:  Corneal rust ring of right eye  Corneal foreign body with residual material, right, initial encounter    Rx / DC Orders ED Discharge Orders    None       Randon Goldsmith, PA-C 04/30/21 1550    06/30/21, PA-C 04/30/21 1551    06/30/21, MD 04/30/21 2240
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
## 2021-08-19 ENCOUNTER — Emergency Department (HOSPITAL_BASED_OUTPATIENT_CLINIC_OR_DEPARTMENT_OTHER)
Admission: EM | Admit: 2021-08-19 | Discharge: 2021-08-20 | Disposition: A | Discharge status: Home / Self Care | Payer: Self-pay | Attending: Emergency Medicine | Admitting: Emergency Medicine

## 2021-08-19 ENCOUNTER — Other Ambulatory Visit: Payer: Self-pay

## 2021-08-19 ENCOUNTER — Encounter (HOSPITAL_BASED_OUTPATIENT_CLINIC_OR_DEPARTMENT_OTHER): Payer: Self-pay

## 2021-08-19 DIAGNOSIS — F17210 Nicotine dependence, cigarettes, uncomplicated: Secondary | ICD-10-CM | POA: Insufficient documentation

## 2021-08-19 DIAGNOSIS — J3489 Other specified disorders of nose and nasal sinuses: Secondary | ICD-10-CM | POA: Insufficient documentation

## 2021-08-19 DIAGNOSIS — R519 Headache, unspecified: Secondary | ICD-10-CM | POA: Insufficient documentation

## 2021-08-19 DIAGNOSIS — R0981 Nasal congestion: Secondary | ICD-10-CM | POA: Insufficient documentation

## 2021-08-19 NOTE — ED Triage Notes (Signed)
Pt c/o HA left forehead/sinus pressure x 1 week-pt states feels like sinus infection with hx of same multiple time x 1 year-NAD-steady gait

## 2021-08-20 MED ORDER — AMOXICILLIN-POT CLAVULANATE 875-125 MG PO TABS: 1.0000 | ORAL_TABLET | Freq: Two times a day (BID) | ORAL | 0 refills | Status: AC

## 2021-08-20 MED ORDER — KETOROLAC TROMETHAMINE 60 MG/2ML IM SOLN
60.0000 mg | Freq: Once | INTRAMUSCULAR | Status: AC
  Administered 2021-08-20: 60 mg via INTRAMUSCULAR
  Filled 2021-08-20: qty 2

## 2021-08-20 MED ORDER — AMOXICILLIN-POT CLAVULANATE 875-125 MG PO TABS
1.0000 | ORAL_TABLET | Freq: Once | ORAL | Status: AC
  Administered 2021-08-20: 1 via ORAL
  Filled 2021-08-20: qty 1

## 2021-08-20 NOTE — ED Provider Notes (Signed)
[facility] EMERGENCY DEPARTMENT Provider Note   CSN: [id number] Arrival date & time: 08/19/21  2144     History Chief Complaint  Patient presents with   Headache    Dustin Wheeler is a 30 y.o. male.   Headache Pain location:  Frontal Quality:  Dull Radiates to:  Does not radiate Severity currently:  5/10 Severity at highest:  5/10 Onset quality:  Gradual Timing:  Constant Progression:  Waxing and waning Chronicity:  Recurrent Similar to prior headaches: yes   Context: not activity   Relieved by:  None tried Worsened by:  Nothing Ineffective treatments:  None tried Associated symptoms: no abdominal pain       History reviewed. No pertinent past medical history.  There are no problems to display for this patient.   History reviewed. No pertinent surgical history.     No family history on file.  Social History   Tobacco Use   Smoking status: Every Day    Packs/day: 0.50    Types: Cigarettes   Smokeless tobacco: Never  Vaping Use   Vaping Use: Never used  Substance Use Topics   Alcohol use: Yes    Comment: weekly   Drug use: Not Currently    Types: Marijuana    Home Medications Prior to Admission medications   Not on File    Allergies    Patient has no known allergies.  Review of Systems   Review of Systems  Gastrointestinal:  Negative for abdominal pain.  Neurological:  Positive for headaches.  All other systems reviewed and are negative.  Physical Exam Updated Vital Signs BP (!) 143/91 (BP Location: Right Arm)   Pulse 66   Temp 98.5 F (36.9 C) (Oral)   Resp 18   Ht 6\' 2"  (1.88 m)   Wt 80.7 kg   SpO2 100%   BMI 22.85 kg/m   Physical Exam Vitals and nursing note reviewed.  Constitutional:      Appearance: He is well-developed.  HENT:     Head: Normocephalic and atraumatic.     Nose: Congestion and rhinorrhea present.     Mouth/Throat:     Mouth: Mucous membranes are moist.     Pharynx: Oropharynx is clear.  Eyes:      Extraocular Movements: Extraocular movements intact.  Cardiovascular:     Rate and Rhythm: Normal rate.  Pulmonary:     Effort: Pulmonary effort is normal. No respiratory distress.  Abdominal:     General: There is no distension.  Musculoskeletal:        General: Normal range of motion.     Cervical back: Normal range of motion.  Skin:    General: Skin is warm and dry.  Neurological:     Mental Status: He is alert.    ED Results / Procedures / Treatments   Labs (all labs ordered are listed, but only abnormal results are displayed) Labs Reviewed - No data to display  EKG None  Radiology No results found.  Procedures Procedures   Medications Ordered in ED Medications  amoxicillin-clavulanate (AUGMENTIN) 875-125 MG per tablet 1 tablet (has no administration in time range)  ketorolac (TORADOL) injection 60 mg (has no administration in time range)    ED Course  I have reviewed the triage vital signs and the nursing notes.  Pertinent labs & imaging results that were available during my care of the patient were reviewed by me and considered in my medical decision making (see chart for details).  MDM Rules/Calculators/A&P                         Likely sinusitis. Over a week of symptoms. Will treat for same.   Final Clinical Impression(s) / ED Diagnoses Final diagnoses:  None    Rx / DC Orders ED Discharge Orders          Ordered    Ambulatory referral to ENT        08/20/21 0014             Jamilee Lafosse, Barbara Cower, MD 08/20/21 [phone]

## 2021-08-20 NOTE — ED Notes (Signed)
Pt A&Ox4 ambulatory a d/c with independent steady gait

## 2021-12-25 IMAGING — CT CT ABD-PELV W/ CM
2 of 4 series · 16 of 46 positions shown, 18 images · IV contrast (Omnipaque)
Comparison: January 19, 2016.

CLINICAL DATA: Acute right lower quadrant abdominal pain.

EXAM:
CT ABDOMEN AND PELVIS WITH CONTRAST
TECHNIQUE: Multidetector CT imaging of the abdomen and pelvis was performed
using the standard protocol following bolus administration of
intravenous contrast.
CONTRAST:  100mL OMNIPAQUE IOHEXOL 300 MG/ML  SOLN

[Series 2: axial st · axial · 0.77mm/px · z∈[-450,-10]mm · 13 of 98 slices shown, 15 images]
[im 5/98  soft-tissue]
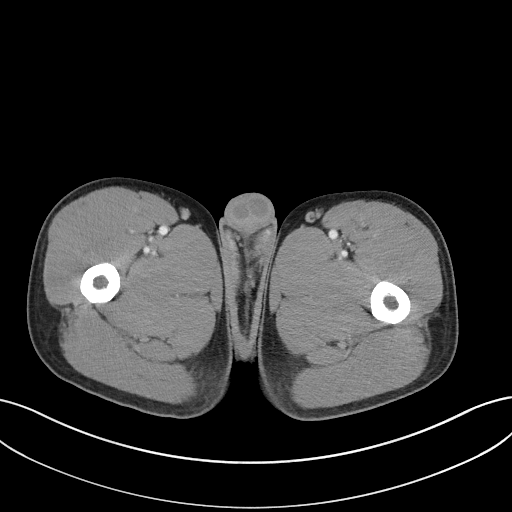
[im 5/98  bone]
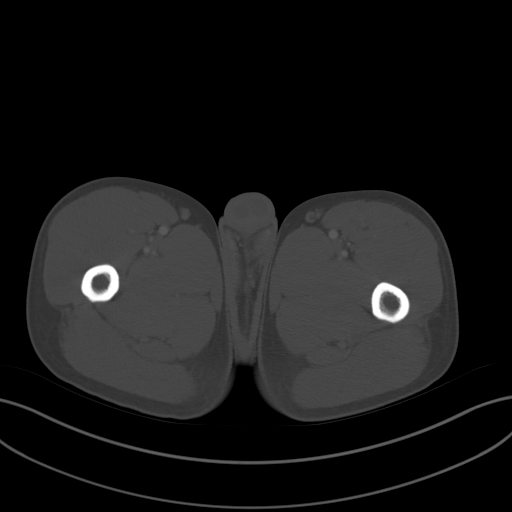
[im 13/98  soft-tissue]
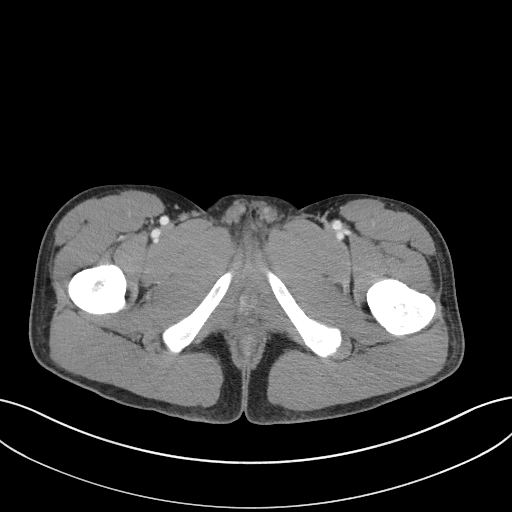
[im 22/98  soft-tissue]
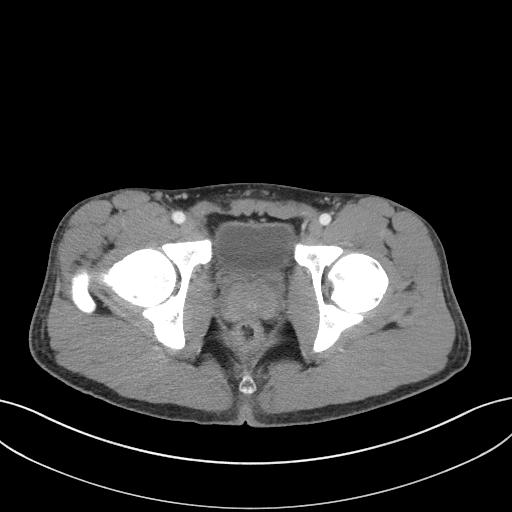
[im 26/98  soft-tissue]
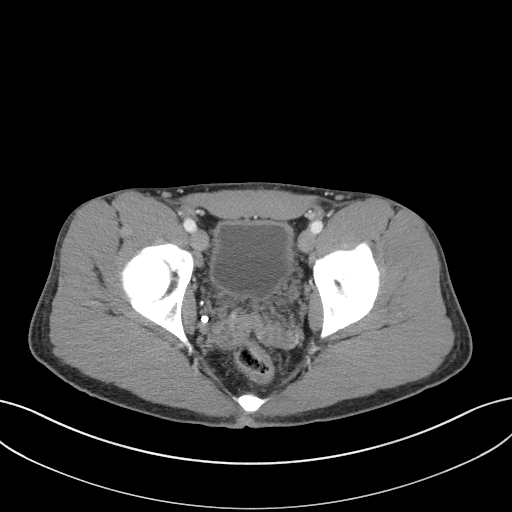
[im 34/98  soft-tissue]
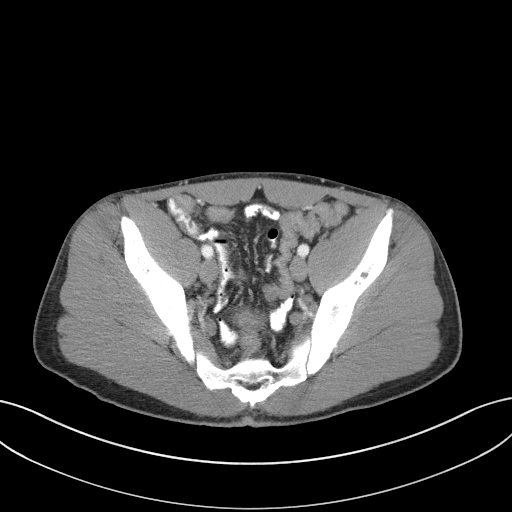
[im 43/98  soft-tissue]
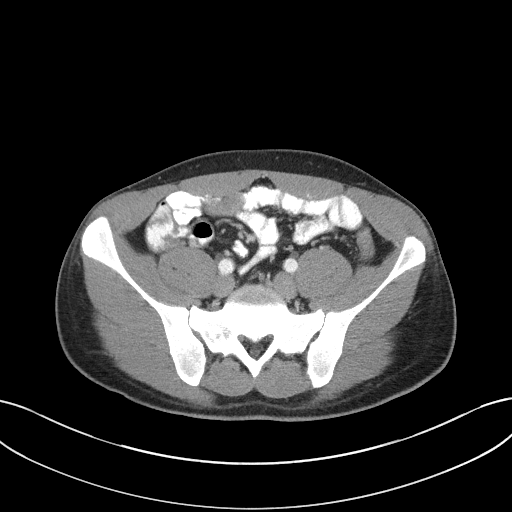
[im 51/98  soft-tissue]
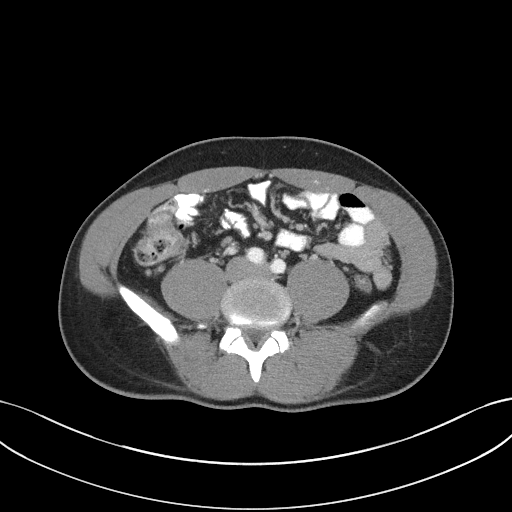
[im 55/98  soft-tissue]
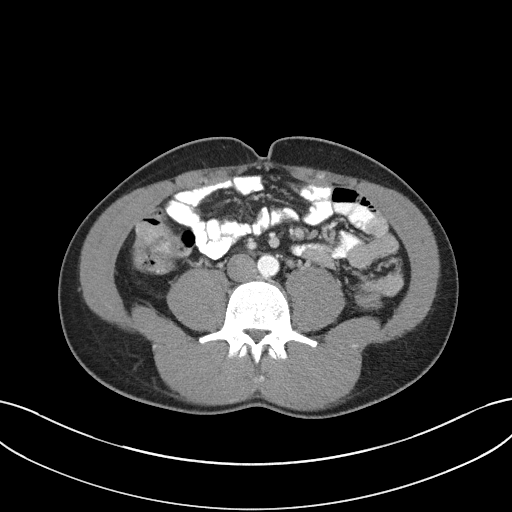
[im 64/98  soft-tissue]
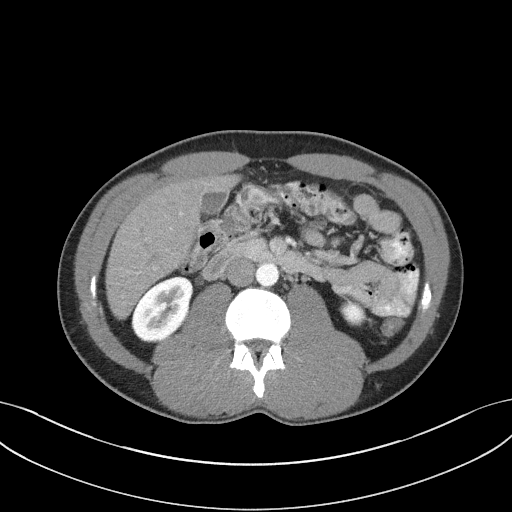
[im 64/98  bone]
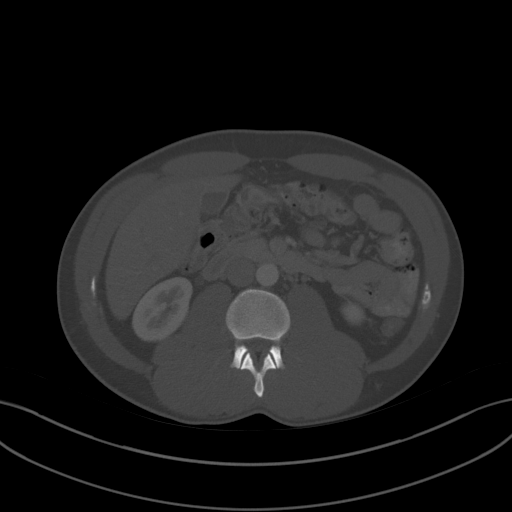
[im 72/98  soft-tissue]
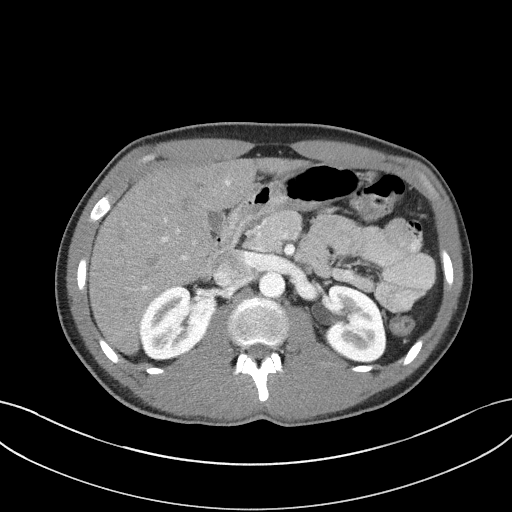
[im 76/98  soft-tissue]
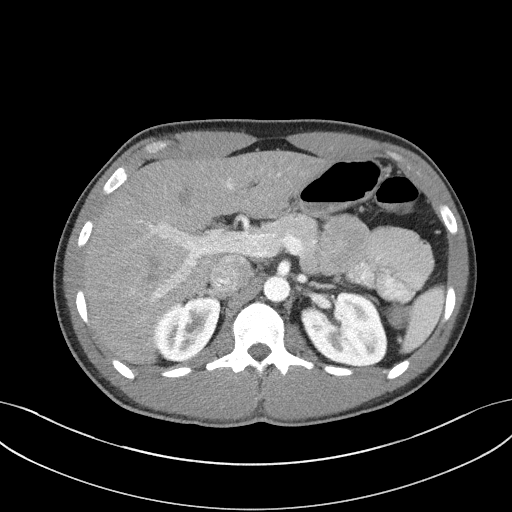
[im 85/98  soft-tissue]
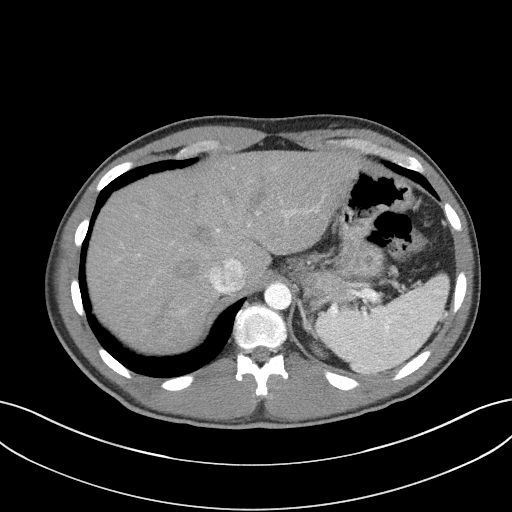
[im 93/98  soft-tissue]
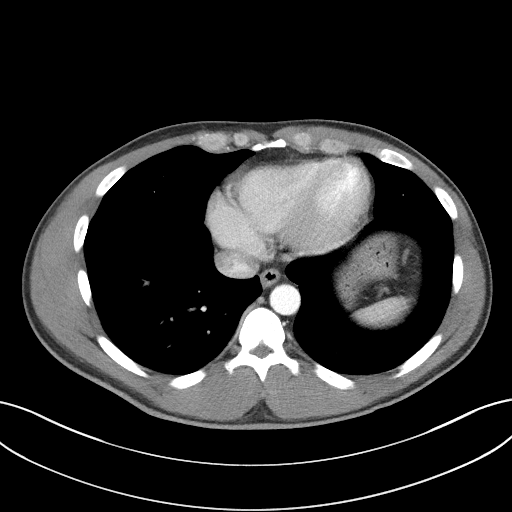

[Series 5: coronal st · coronal · 0.75mm/px · 3 of 81 slices shown]
[im 27/81  soft-tissue]
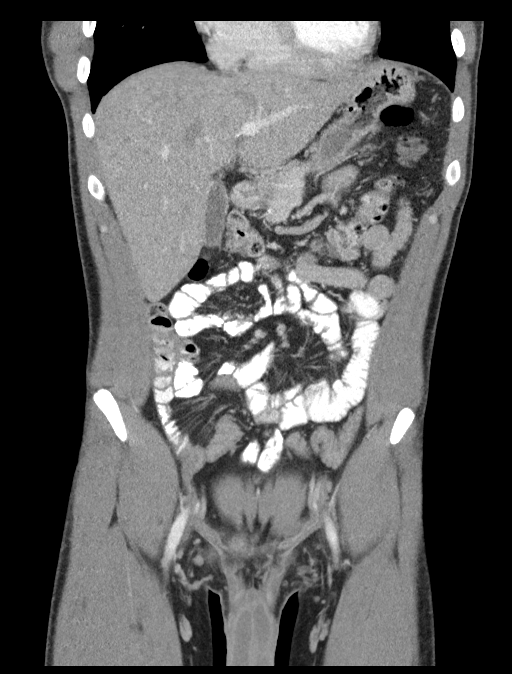
[im 36/81  soft-tissue]
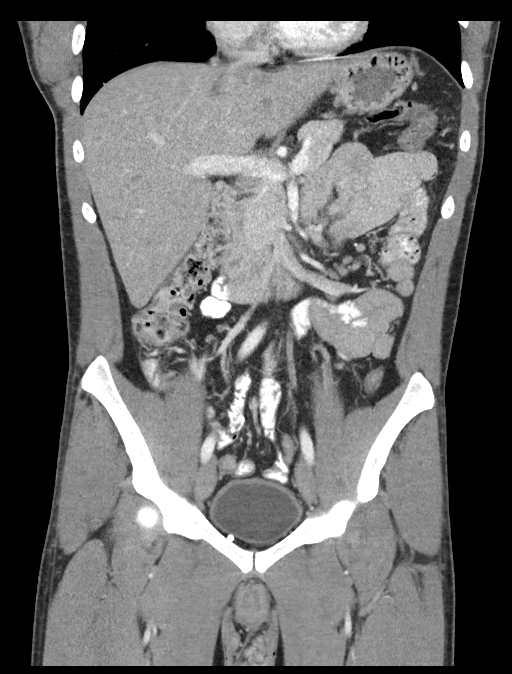
[im 45/81  soft-tissue]
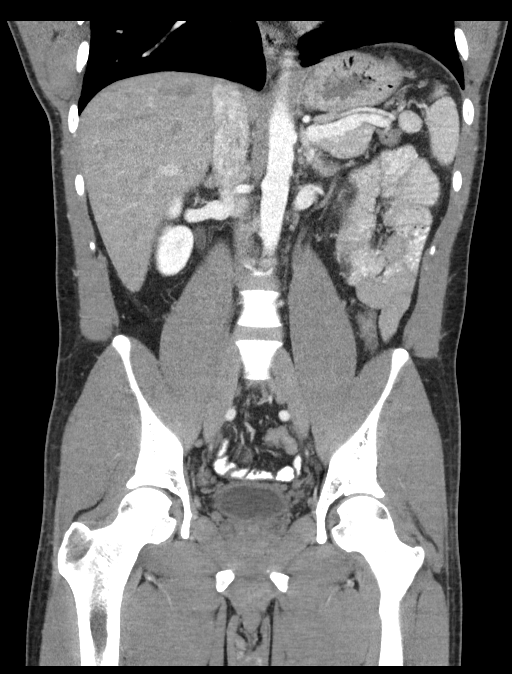

[16 of 46 positions shown; findings below may reference images not displayed]

FINDINGS: Lower chest: No acute abnormality.

Hepatobiliary: No focal liver abnormality is seen. No gallstones,
gallbladder wall thickening, or biliary dilatation.

Pancreas: Unremarkable. No pancreatic ductal dilatation or
surrounding inflammatory changes.

Spleen: Normal in size without focal abnormality.

Adrenals/Urinary Tract: Adrenal glands are unremarkable. Kidneys are
normal, without renal calculi, focal lesion, or hydronephrosis.
Bladder is unremarkable.

Stomach/Bowel: Stomach is within normal limits. Appendix appears
normal. No evidence of bowel wall thickening, distention, or
inflammatory changes.

Vascular/Lymphatic: No significant vascular findings are present. No
enlarged abdominal or pelvic lymph nodes.

Reproductive: Prostate is unremarkable.

Other: No abdominal wall hernia or abnormality. No abdominopelvic
ascites.

Musculoskeletal: No acute or significant osseous findings.
IMPRESSION: No abnormality seen in the abdomen or pelvis.

## 2022-04-26 ENCOUNTER — Emergency Department (HOSPITAL_BASED_OUTPATIENT_CLINIC_OR_DEPARTMENT_OTHER)
Admission: EM | Admit: 2022-04-26 | Discharge: 2022-04-26 | Disposition: A | Discharge status: Home / Self Care | Payer: Self-pay | Attending: Emergency Medicine | Admitting: Emergency Medicine

## 2022-04-26 ENCOUNTER — Emergency Department (HOSPITAL_BASED_OUTPATIENT_CLINIC_OR_DEPARTMENT_OTHER): Payer: Self-pay

## 2022-04-26 ENCOUNTER — Other Ambulatory Visit: Payer: Self-pay

## 2022-04-26 ENCOUNTER — Encounter (HOSPITAL_BASED_OUTPATIENT_CLINIC_OR_DEPARTMENT_OTHER): Payer: Self-pay | Admitting: Emergency Medicine

## 2022-04-26 DIAGNOSIS — W228XXA Striking against or struck by other objects, initial encounter: Secondary | ICD-10-CM | POA: Insufficient documentation

## 2022-04-26 DIAGNOSIS — S62336A Displaced fracture of neck of fifth metacarpal bone, right hand, initial encounter for closed fracture: Secondary | ICD-10-CM | POA: Insufficient documentation

## 2022-04-26 DIAGNOSIS — S62339A Displaced fracture of neck of unspecified metacarpal bone, initial encounter for closed fracture: Secondary | ICD-10-CM

## 2022-04-26 MED ORDER — IBUPROFEN 800 MG PO TABS
800.0000 mg | ORAL_TABLET | Freq: Once | ORAL | Status: AC
  Administered 2022-04-26: 800 mg via ORAL
  Filled 2022-04-26: qty 1

## 2022-04-26 MED ORDER — OXYCODONE HCL 5 MG PO TABS: 5.0000 mg | ORAL_TABLET | Freq: Four times a day (QID) | ORAL | 0 refills | Status: DC | PRN

## 2022-04-26 MED ORDER — IBUPROFEN 600 MG PO TABS: 600.0000 mg | ORAL_TABLET | Freq: Four times a day (QID) | ORAL | 0 refills | Status: DC | PRN

## 2022-04-26 NOTE — Discharge Instructions (Addendum)
Please take Motrin 600 mg every 6 hours as needed for pain. ? ?I also called over prescription for a medication called oxycodone.  Please take 5 mg every 6 hours as needed for pain.  It is safe to take this medication with the Motrin.  This medication is a narcotic so please be safe while using it and do not drive or operate heavy machinery. ? ?Please call and make appointment with orthopedic surgeon first thing tomorrow morning for fifth metacarpal fracture otherwise known as boxer's fracture. ? ? ?
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
## 2023-11-15 IMAGING — DX DG HAND COMPLETE 3+V*R*
3 series · 3 of 3 positions shown · non-contrast
Comparison: None Available.

CLINICAL DATA: Right hand injury.

EXAM:
RIGHT HAND - COMPLETE 3+ VIEW

[hand pa]
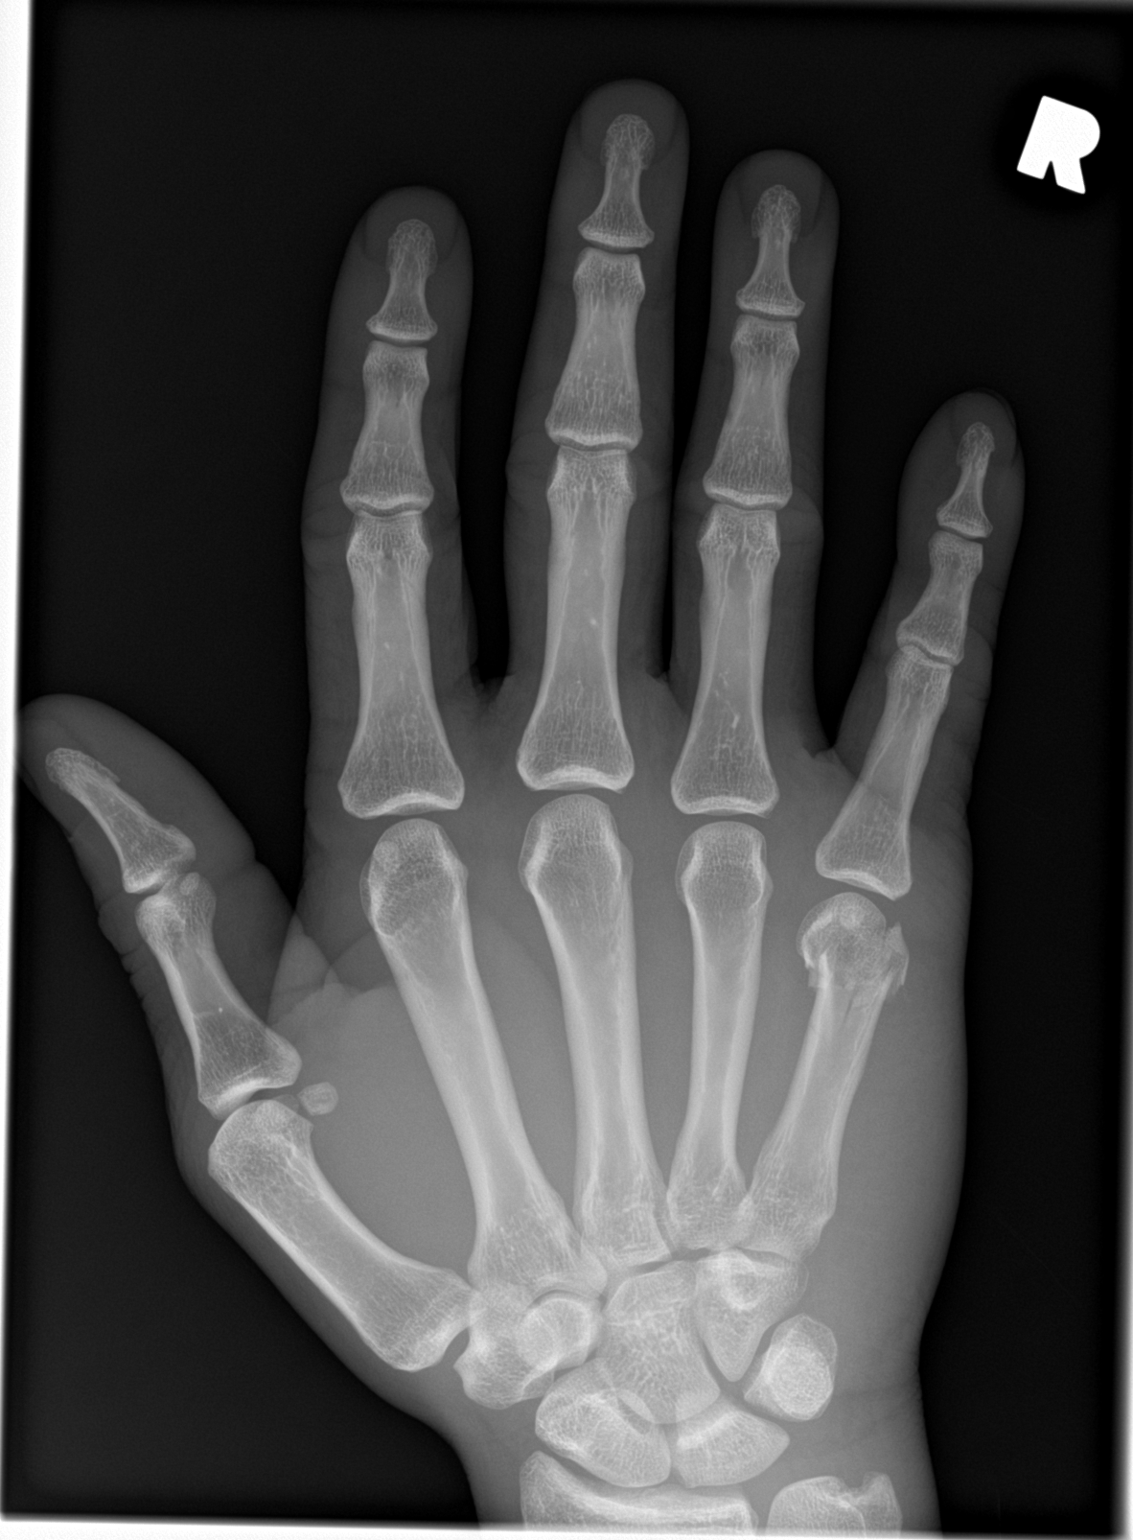

[hand obl]
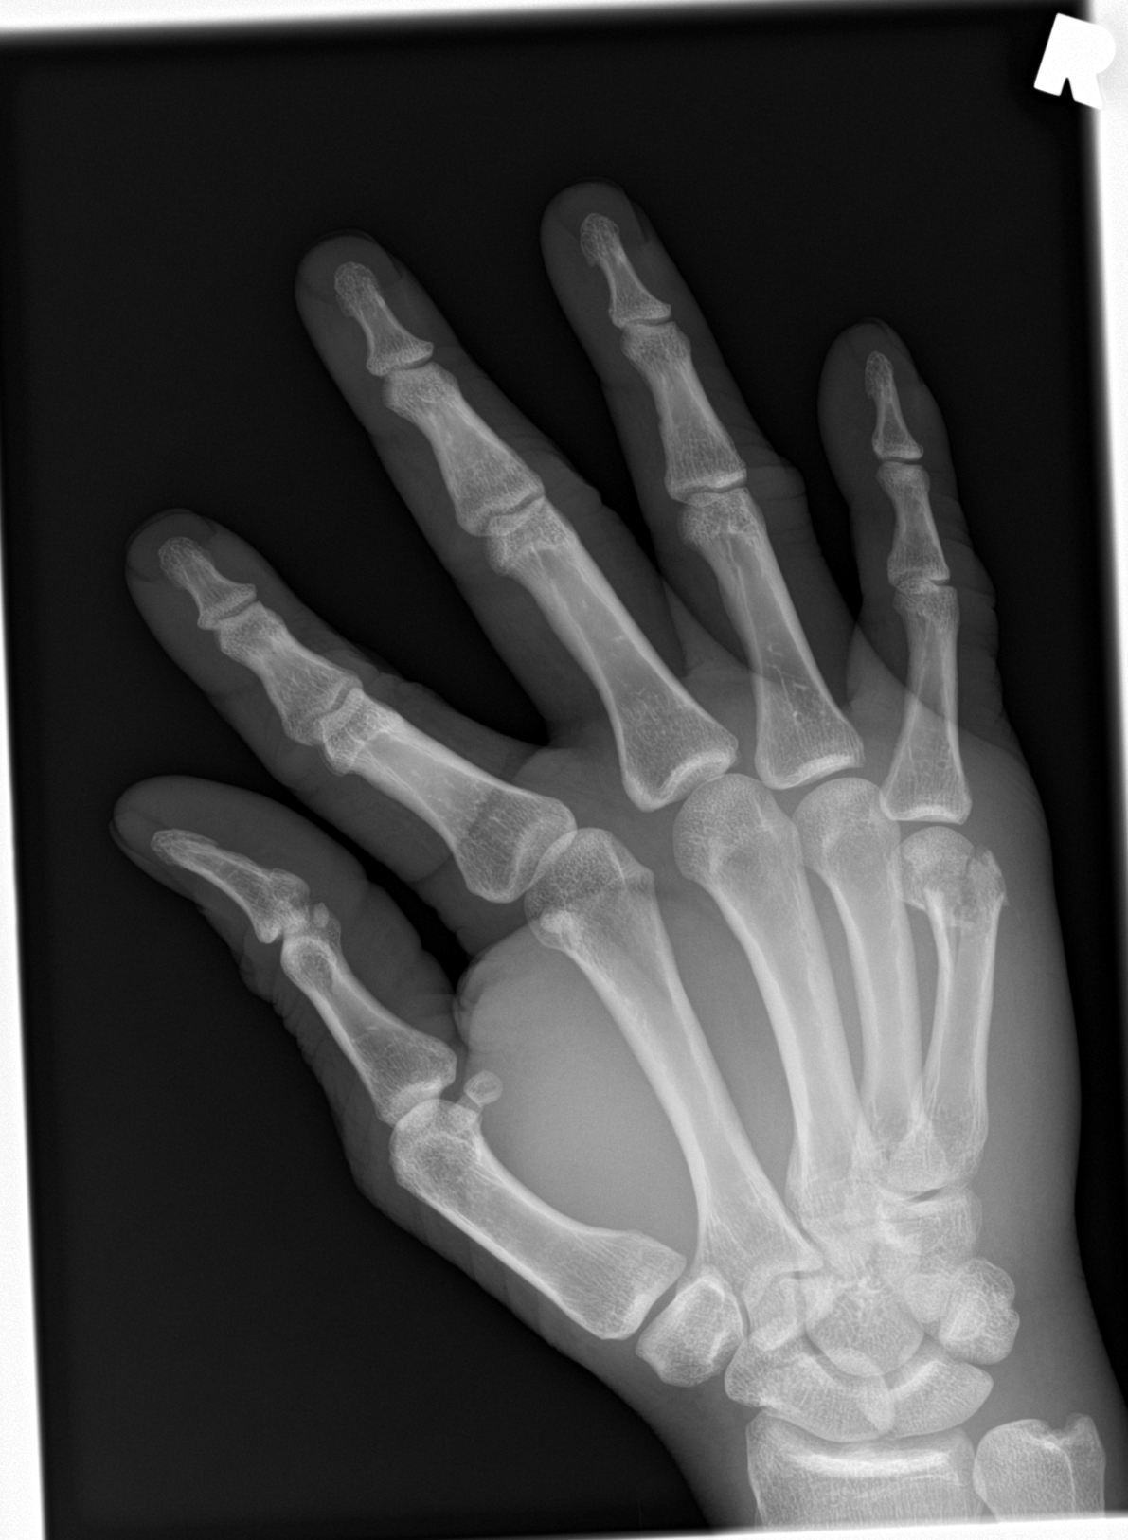

[hand lat]
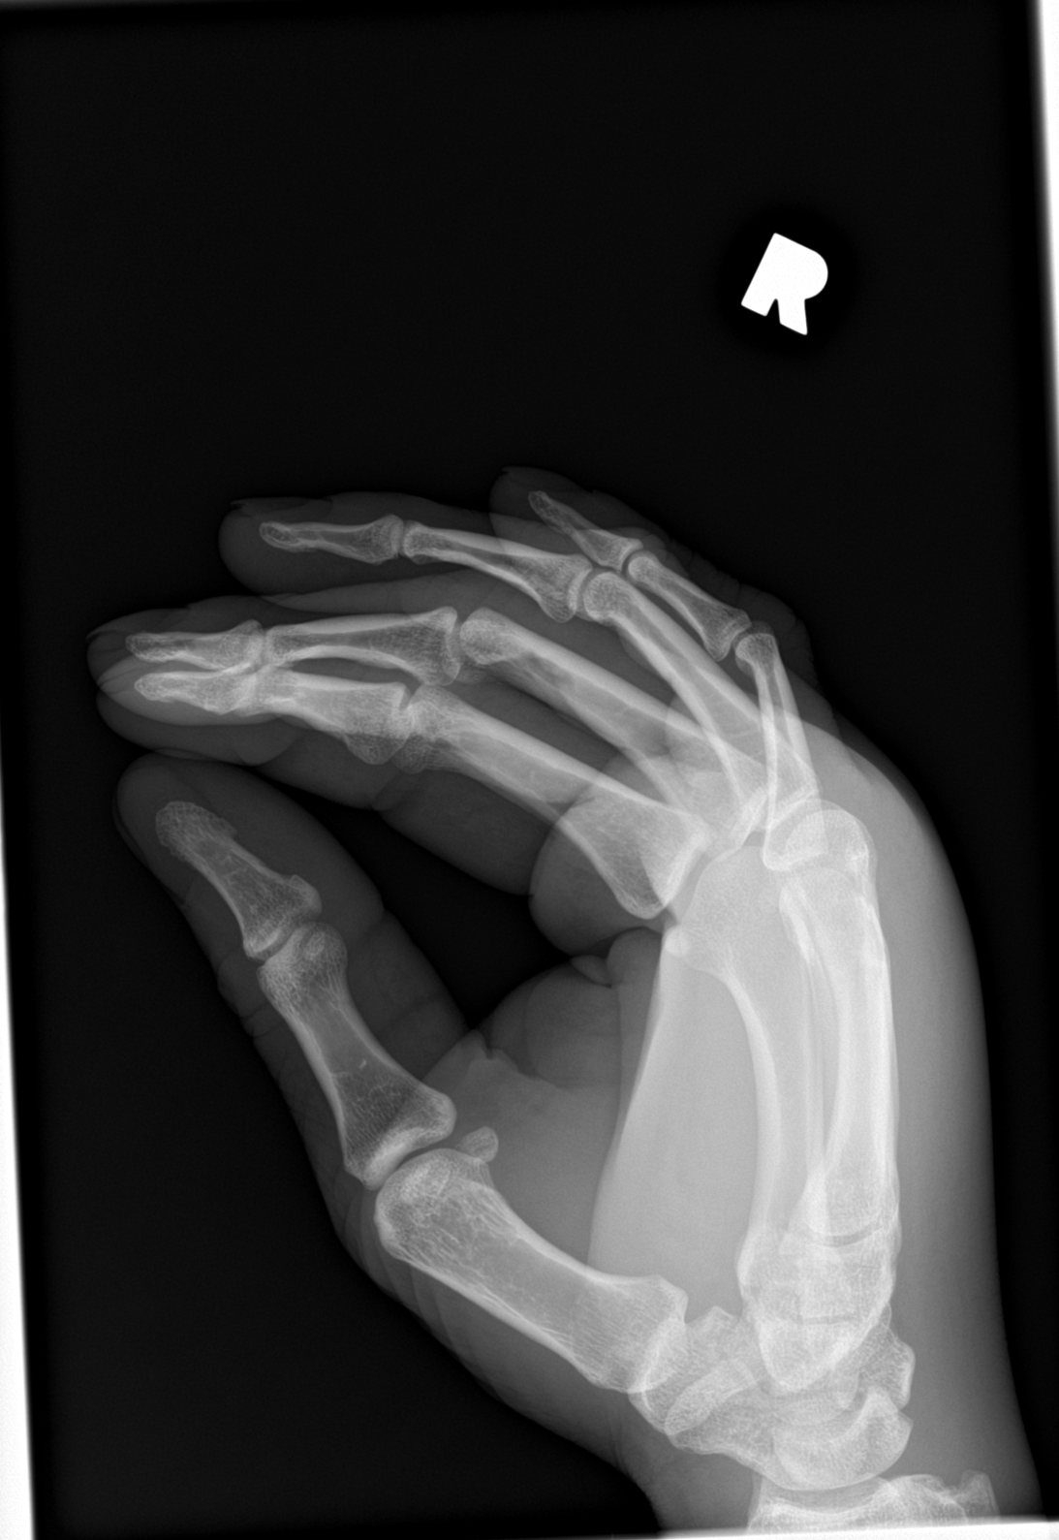

[3 of 3 positions shown; findings below may reference images not displayed]

FINDINGS: Comminuted fracture of the distal right fifth metacarpal present.
Fracture demonstrates mild displacement and volar angulation. No
dislocation. No additional injuries identified. Soft tissue swelling
identified without soft tissue foreign body.
IMPRESSION: Comminuted fracture of the distal right fifth metacarpal
demonstrating mild displacement and angulation.

## 2024-09-23 ENCOUNTER — Emergency Department (HOSPITAL_BASED_OUTPATIENT_CLINIC_OR_DEPARTMENT_OTHER)
Admission: EM | Admit: 2024-09-23 | Discharge: 2024-09-23 | Disposition: A | Discharge status: Home / Self Care | Payer: Self-pay | Attending: Emergency Medicine | Admitting: Emergency Medicine

## 2024-09-23 ENCOUNTER — Emergency Department (HOSPITAL_BASED_OUTPATIENT_CLINIC_OR_DEPARTMENT_OTHER): Payer: Self-pay

## 2024-09-23 ENCOUNTER — Encounter (HOSPITAL_BASED_OUTPATIENT_CLINIC_OR_DEPARTMENT_OTHER): Payer: Self-pay | Admitting: *Deleted

## 2024-09-23 ENCOUNTER — Other Ambulatory Visit: Payer: Self-pay

## 2024-09-23 DIAGNOSIS — M79604 Pain in right leg: Secondary | ICD-10-CM | POA: Diagnosis present

## 2024-09-23 DIAGNOSIS — S82891A Other fracture of right lower leg, initial encounter for closed fracture: Secondary | ICD-10-CM | POA: Diagnosis not present

## 2024-09-23 MED ORDER — HYDROCODONE-ACETAMINOPHEN 5-325 MG PO TABS
2.0000 | ORAL_TABLET | Freq: Once | ORAL | Status: AC
  Administered 2024-09-23: 2 via ORAL
  Filled 2024-09-23: qty 2

## 2024-09-23 MED ORDER — HYDROCODONE-ACETAMINOPHEN 5-325 MG PO TABS: 1.0000 | ORAL_TABLET | Freq: Four times a day (QID) | ORAL | 0 refills | Status: DC | PRN
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
## 2024-09-23 NOTE — ED Notes (Signed)
 Patient family member/ friend arrives at the bedside to transport patient home. Asst patient into wheelchair with crutches and discharge instruction provided.

## 2024-09-23 NOTE — ED Notes (Signed)
 Patient received pain medication prior to discharge. Per patient request, called number given for transportation home - no answer. Patient is resting in room at this time. Awaiting response from call to Brittni- Family member.

## 2024-09-23 NOTE — Discharge Instructions (Signed)
 Elevate your leg is much as possible.  No weightbearing until cleared by orthopedics.  Ice for 20 minutes every 2 hours while awake for the next 2 days.  Take hydrocodone  as prescribed as needed for pain.  Follow-up with orthopedic surgery as I suspect this fracture will require surgical repair.  The contact information for Dr. Germaine has been provided in this discharge summary for you to call Monday morning and make these arrangements.

## 2024-09-23 NOTE — ED Notes (Signed)
 Pt is now laid supine in bed with both side rails in place. Requires assistance for transfers
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
## 2024-09-23 NOTE — ED Notes (Addendum)
 Ice pack applied and shoe and sock removed from pts right leg.  Noticeable soft tissue swelling to the outer portion of the ankle.  PMS is intact.  Pt is seated in a wheel chair with the foot elevated on bed.

## 2024-09-25 ENCOUNTER — Ambulatory Visit (INDEPENDENT_AMBULATORY_CARE_PROVIDER_SITE_OTHER): Payer: Self-pay

## 2024-09-25 VITALS — BP 142/100 | Ht 74.0 in | Wt 200.0 lb

## 2024-09-25 DIAGNOSIS — S82861A Displaced Maisonneuve's fracture of right leg, initial encounter for closed fracture: Secondary | ICD-10-CM

## 2024-09-25 DIAGNOSIS — S82391A Other fracture of lower end of right tibia, initial encounter for closed fracture: Secondary | ICD-10-CM

## 2024-09-25 DIAGNOSIS — S8251XA Displaced fracture of medial malleolus of right tibia, initial encounter for closed fracture: Secondary | ICD-10-CM

## 2024-09-25 NOTE — Patient Instructions (Addendum)
 Please schedule with Dr. Elsa as soon as possible [facility] [address] [place], [place] [address] Phone: [phone]
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
## 2024-10-08 ENCOUNTER — Encounter: Payer: Self-pay | Admitting: Orthopaedic Surgery

## 2024-10-08 ENCOUNTER — Other Ambulatory Visit: Payer: Self-pay | Admitting: Orthopaedic Surgery

## 2024-10-08 ENCOUNTER — Other Ambulatory Visit

## 2024-10-08 DIAGNOSIS — T148XXA Other injury of unspecified body region, initial encounter: Secondary | ICD-10-CM

## 2024-10-09 NOTE — Telephone Encounter (Signed)
 Return for sign off  [facility]  signing off   Jon Bloch BSN, Occupational psychologist at [employer] ([employer] ) Secure Chat Group: [facility] Telehospitalist [phone]

## 2024-10-09 NOTE — Progress Notes (Signed)
     Patient ID: Dustin Wheeler is a 33 y.o. male.   HPI Dustin Wheeler is being seen today by [facility]) for Virtual Visit F/U  Patient Denies Shortness of breath , Chest Pain, Abd Pain, or N/V/D.   Vital Signs:  10/09/2024 Vitals:   10/09/24 1102  BP: 130/88  Pulse: 70  Resp: 18  Temp: 98.8 F (37.1 C)  SpO2: 98%      Lung sounds were assessed and chest clear, no wheezing, rales, normal symmetric air entry.  MEDICATIONS: Was a full MedRec completed: No Medications were reviewed: Yes All Medications were accounted for: Yes  Visit Summary: Arrived to find the pt laying in bed. Pt alert and oriented. Skim warm and dry. Pt denied any bleeding or GI issues. Pt denied any new pain or complaints. Video call initiated with the provider. Labs drawn. Pt has no questions. Pt has appointment Thursday.  TASK PERFORMED/ MED'S GIVEN: Labs Drawn CBC     OTHER: Interpreter was used today:No Pt is being Monitored by [facility] RPM: No  10/09/2024  11:07 AM Curtistine JINNY Lamprey, EMT-P

## 2024-10-09 NOTE — Telephone Encounter (Signed)
 Medication not covered by insurance per pt. Provider updated and to follow-up in the a.m. Patient made aware.   Jon Bloch BSN, Occupational psychologist at [employer] ([employer] ) Secure Chat Group: [facility] Telehospitalist [phone]

## 2024-10-10 ENCOUNTER — Ambulatory Visit
Admission: RE | Admit: 2024-10-10 | Discharge: 2024-10-10 | Disposition: A | Discharge status: Home / Self Care | Source: Ambulatory Visit | Attending: Orthopaedic Surgery | Admitting: Orthopaedic Surgery

## 2024-10-10 ENCOUNTER — Encounter (HOSPITAL_COMMUNITY): Payer: Self-pay | Admitting: Orthopaedic Surgery

## 2024-10-10 ENCOUNTER — Other Ambulatory Visit: Payer: Self-pay

## 2024-10-10 DIAGNOSIS — T148XXA Other injury of unspecified body region, initial encounter: Secondary | ICD-10-CM

## 2024-10-10 NOTE — Progress Notes (Signed)
 Anesthesia Chart Review: Same day workup  33 year old male with pertinent history including chronic headaches, allergic rhinitis, snoring with suspicion for OSA, restless leg syndrome.  Patient had recent fall resulting in right Maisonneuve  injury with proximal fibular fracture and posterior malleolus fracture.  He was seen outpatient by orthopedics with plan for surgical intervention.  He was prescribed oxycodone  for pain management.  Reportedly, he ran out of this and started using BC powder to control pain, taking multiple packets per day.  He presented to the ED on 10/05/2024 with 2 to 3-day history of melena, hematemesis, and syncopal episode.  On presentation patient was found to be tachycardic with sinus rhythm in the 140s, systolic BP >100.  Initial hemoglobin 11.6.  Started on IV fluids and PPI.  GI consulted. S/p EGD on 10/18 with single 5 mm x 7 mm deep, round ulcer in the prepyloric region and anterior wall of the stomach with adherent clot placed 2 clips successfully; hemostasis achieved; closure achieved.  Hemoglobin trended down during admission with nadir 8.6 on 10/07/2024.  Outpatient recheck on 10/09/2024 with improvement to 9.4.  Patient will need day of surgery labs and evaluation.     Lynwood Geofm RIGGERS [facility] Short Stay Center/Anesthesiology Phone [phone] 10/10/2024 11:24 AM

## 2024-10-10 NOTE — Progress Notes (Signed)
 SDW CALL  Patient was given pre-op instructions over the phone. The opportunity was given for the patient to ask questions. No further questions asked. Patient verbalized understanding of instructions given.  Date & arrival time- October 11, 2024 @ 8:45 am.  PCP - No PCP Cardiologist -   PPM/ICD - denies Device Orders - n/a Rep Notified - n/a  Chest x-ray - 10-05-24 EKG - 10-05-24 Stress Test - denies ECHO - denies Cardiac Cath - denies  Sleep Study - denies CPAP - n/a  Dm -denies  Blood Thinner Instructions:denies Aspirin Instructions:denies  ERAS Protcol - clear liquids until 8:15 AM Medications - tylenol , prilosec  COVID TEST- n/a   Anesthesia review: Yes recent hospital visit  Patient denies shortness of breath, fever, cough and chest pain over the phone call   All instructions explained to the patient, with a verbal understanding of the material. Patient agrees to go over the instructions while at home for a better understanding.

## 2024-10-10 NOTE — Anesthesia Preprocedure Evaluation (Signed)
 Anesthesia Evaluation  Patient identified by MRN, date of birth, ID band Patient awake    Reviewed: Allergy & Precautions, NPO status , Patient's Chart, lab work & pertinent test results  History of Anesthesia Complications Negative for: history of anesthetic complications  Airway Mallampati: II  TM Distance: >3 FB Neck ROM: Full    Dental no notable dental hx. (+) Teeth Intact   Pulmonary neg sleep apnea, neg COPD, Current SmokerPatient did not abstain from smoking.   Pulmonary exam normal breath sounds clear to auscultation       Cardiovascular Exercise Tolerance: Good METS(-) hypertension(-) CAD and (-) Past MI negative cardio ROS (-) dysrhythmias  Rhythm:Regular Rate:Normal - Systolic murmurs    Neuro/Psych negative neurological ROS  negative psych ROS   GI/Hepatic ,neg GERD  ,,(+)     (-) substance abuse    Endo/Other  neg diabetes    Renal/GU negative Renal ROS     Musculoskeletal   Abdominal   Peds  Hematology  (+) Blood dyscrasia, anemia   Anesthesia Other Findings 33 year old male with pertinent history including chronic headaches, allergic rhinitis, snoring with suspicion for OSA, restless leg syndrome.  Patient had recent fall resulting in right Maisonneuve  injury with proximal fibular fracture and posterior malleolus fracture.  He was seen outpatient by orthopedics with plan for surgical intervention.  He was prescribed oxycodone  for pain management.  Reportedly, he ran out of this and started using BC powder to control pain, taking multiple packets per day.  He presented to the ED on 10/05/2024 with 2 to 3-day history of melena, hematemesis, and syncopal episode.  On presentation patient was found to be tachycardic with sinus rhythm in the 140s, systolic BP >100.  Initial hemoglobin 11.6.  Started on IV fluids and PPI.  GI consulted. S/p EGD on 10/18 with single 5 mm x 7 mm deep, round ulcer in the  prepyloric region and anterior wall of the stomach with adherent clot placed 2 clips successfully; hemostasis achieved; closure achieved.  Hemoglobin trended down during admission with nadir 8.6 on 10/07/2024.  Outpatient recheck on 10/09/2024 with improvement to 9.4.  Reproductive/Obstetrics                              Anesthesia Physical Anesthesia Plan  ASA: 2  Anesthesia Plan: General   Post-op Pain Management: Regional block* and Ofirmev  IV (intra-op)*   Induction: Intravenous  PONV Risk Score and Plan: 2 and Ondansetron , Dexamethasone and Midazolam  Airway Management Planned: LMA  Additional Equipment: None  Intra-op Plan:   Post-operative Plan: Extubation in OR  Informed Consent: I have reviewed the patients History and Physical, chart, labs and discussed the procedure including the risks, benefits and alternatives for the proposed anesthesia with the patient or authorized representative who has indicated his/her understanding and acceptance.     Dental advisory given  Plan Discussed with: CRNA and Surgeon  Anesthesia Plan Comments: (Discussed risks of anesthesia with patient, including PONV, sore throat, lip/dental/eye damage. Rare risks discussed as well, such as cardiorespiratory and neurological sequelae, and allergic reactions. Discussed the role of CRNA in patient's perioperative care. Patient understands. Patient counseled on benefits of smoking cessation, and increased perioperative risks associated with continued smoking.  Discussed r/b/a of adductor canal and popliteal nerve block, including:  - bleeding, infection, nerve damage - poor or non functioning block. - reactions and toxicity to local anesthetic Patient understands. )  Anesthesia Quick Evaluation

## 2024-10-11 ENCOUNTER — Ambulatory Visit (HOSPITAL_COMMUNITY): Payer: Self-pay | Admitting: Physician Assistant

## 2024-10-11 ENCOUNTER — Encounter: Admission: RE | Disposition: A | Payer: Self-pay | Attending: Orthopaedic Surgery

## 2024-10-11 ENCOUNTER — Ambulatory Visit (HOSPITAL_COMMUNITY)

## 2024-10-11 ENCOUNTER — Ambulatory Visit (HOSPITAL_COMMUNITY)
Admission: RE | Admit: 2024-10-11 | Discharge: 2024-10-11 | Disposition: A | Discharge status: Home / Self Care | Attending: Orthopaedic Surgery | Admitting: Orthopaedic Surgery

## 2024-10-11 ENCOUNTER — Other Ambulatory Visit (HOSPITAL_COMMUNITY): Payer: Self-pay

## 2024-10-11 ENCOUNTER — Encounter (HOSPITAL_COMMUNITY): Payer: Self-pay | Admitting: Orthopaedic Surgery

## 2024-10-11 DIAGNOSIS — F17210 Nicotine dependence, cigarettes, uncomplicated: Secondary | ICD-10-CM | POA: Diagnosis not present

## 2024-10-11 DIAGNOSIS — W19XXXA Unspecified fall, initial encounter: Secondary | ICD-10-CM | POA: Diagnosis not present

## 2024-10-11 DIAGNOSIS — S82861A Displaced Maisonneuve's fracture of right leg, initial encounter for closed fracture: Secondary | ICD-10-CM | POA: Diagnosis present

## 2024-10-11 DIAGNOSIS — S82831D Other fracture of upper and lower end of right fibula, subsequent encounter for closed fracture with routine healing: Secondary | ICD-10-CM

## 2024-10-11 HISTORY — PX: ARTHROSCOPY, ANKLE WITH DEBRIDEMENT: SHX7318

## 2024-10-11 HISTORY — PX: ORIF ANKLE FRACTURE: SHX5408

## 2024-10-11 HISTORY — PX: CLOSED REDUCTION FIBULA: SHX5411

## 2024-10-11 HISTORY — PX: SYNDESMOSIS REPAIR: SHX5182

## 2024-10-11 HISTORY — DX: Restless legs syndrome: G25.81

## 2024-10-11 SURGERY — ARTHROSCOPY, ANKLE WITH DEBRIDEMENT
Anesthesia: General | Site: Ankle | Laterality: Right

## 2024-10-11 MED ORDER — CEFAZOLIN SODIUM 1 G IJ SOLR
INTRAMUSCULAR | Status: AC
  Filled 2024-10-11: qty 20

## 2024-10-11 MED ORDER — LIDOCAINE 2% (20 MG/ML) 5 ML SYRINGE
INTRAMUSCULAR | Status: AC
  Filled 2024-10-11: qty 5

## 2024-10-11 MED ORDER — CHLORHEXIDINE GLUCONATE 0.12 % MT SOLN
15.0000 mL | Freq: Once | OROMUCOSAL | Status: AC
  Administered 2024-10-11: 15 mL via OROMUCOSAL
  Filled 2024-10-11: qty 15

## 2024-10-11 MED ORDER — LACTATED RINGERS IV SOLN
INTRAVENOUS | Status: DC
Start: 2024-10-11 — End: 2024-10-11

## 2024-10-11 MED ORDER — CEFAZOLIN SODIUM-DEXTROSE 2-4 GM/100ML-% IV SOLN
2.0000 g | INTRAVENOUS | Status: AC
  Administered 2024-10-11: 2 g via INTRAVENOUS
  Filled 2024-10-11: qty 100

## 2024-10-11 MED ORDER — MIDAZOLAM HCL (PF) 2 MG/2ML IJ SOLN
2.0000 mg | Freq: Once | INTRAMUSCULAR | Status: AC
  Filled 2024-10-11: qty 2

## 2024-10-11 MED ORDER — ASPIRIN 325 MG PO TABS
ORAL_TABLET | ORAL | 0 refills | Status: AC
  Filled 2024-10-11: qty 30, 30d supply, fill #0

## 2024-10-11 MED ORDER — POVIDONE-IODINE 7.5 % EX SOLN
Freq: Once | CUTANEOUS | Status: DC
  Filled 2024-10-11: qty 118

## 2024-10-11 MED ORDER — FENTANYL CITRATE (PF) 100 MCG/2ML IJ SOLN
INTRAMUSCULAR | Status: AC
  Filled 2024-10-11: qty 2

## 2024-10-11 MED ORDER — MIDAZOLAM HCL 2 MG/2ML IJ SOLN
INTRAMUSCULAR | Status: AC
  Administered 2024-10-11: 2 mg via INTRAVENOUS
  Filled 2024-10-11: qty 2

## 2024-10-11 MED ORDER — FENTANYL CITRATE (PF) 100 MCG/2ML IJ SOLN: 25.0000 ug | INTRAMUSCULAR | Status: DC | PRN

## 2024-10-11 MED ORDER — OXYCODONE HCL 5 MG PO TABS
5.0000 mg | ORAL_TABLET | ORAL | 0 refills | Status: AC | PRN
  Filled 2024-10-11: qty 30, 5d supply, fill #0

## 2024-10-11 MED ORDER — SODIUM CHLORIDE (PF) 0.9 % IJ SOLN
INTRAMUSCULAR | Status: AC
  Filled 2024-10-11: qty 10

## 2024-10-11 MED ORDER — ACETAMINOPHEN 10 MG/ML IV SOLN
INTRAVENOUS | Status: AC
  Filled 2024-10-11: qty 100

## 2024-10-11 MED ORDER — OXYCODONE HCL 5 MG/5ML PO SOLN: 5.0000 mg | Freq: Once | ORAL | Status: DC | PRN

## 2024-10-11 MED ORDER — ORAL CARE MOUTH RINSE: 15.0000 mL | Freq: Once | OROMUCOSAL | Status: AC

## 2024-10-11 MED ORDER — PROPOFOL 10 MG/ML IV BOLUS
INTRAVENOUS | Status: AC
  Filled 2024-10-11: qty 20

## 2024-10-11 MED ORDER — ONDANSETRON HCL 4 MG/2ML IJ SOLN
INTRAMUSCULAR | Status: DC | PRN
  Administered 2024-10-11: 4 mg via INTRAVENOUS

## 2024-10-11 MED ORDER — FENTANYL CITRATE (PF) 100 MCG/2ML IJ SOLN
INTRAMUSCULAR | Status: DC | PRN
  Administered 2024-10-11 (×2): 50 ug via INTRAVENOUS

## 2024-10-11 MED ORDER — ACETAMINOPHEN 500 MG PO TABS
1000.0000 mg | ORAL_TABLET | Freq: Once | ORAL | Status: DC
  Filled 2024-10-11: qty 2

## 2024-10-11 MED ORDER — ACETAMINOPHEN 10 MG/ML IV SOLN
INTRAVENOUS | Status: DC | PRN
  Administered 2024-10-11: 1000 mg via INTRAVENOUS

## 2024-10-11 MED ORDER — BUPIVACAINE HCL (PF) 0.25 % IJ SOLN
INTRAMUSCULAR | Status: DC | PRN
Start: 2024-10-11 — End: 2024-10-11
  Administered 2024-10-11 (×2): 20 mL via PERINEURAL

## 2024-10-11 MED ORDER — LIDOCAINE 2% (20 MG/ML) 5 ML SYRINGE
INTRAMUSCULAR | Status: DC | PRN
Start: 2024-10-11 — End: 2024-10-11
  Administered 2024-10-11: 100 mg via INTRAVENOUS

## 2024-10-11 MED ORDER — ACETAMINOPHEN 10 MG/ML IV SOLN: 1000.0000 mg | Freq: Once | INTRAVENOUS | Status: DC | PRN

## 2024-10-11 MED ORDER — 0.9 % SODIUM CHLORIDE (POUR BTL) OPTIME
TOPICAL | Status: DC | PRN
  Administered 2024-10-11: 1000 mL

## 2024-10-11 MED ORDER — DROPERIDOL 2.5 MG/ML IJ SOLN: 0.6250 mg | Freq: Once | INTRAMUSCULAR | Status: DC | PRN

## 2024-10-11 MED ORDER — DEXAMETHASONE SOD PHOSPHATE PF 10 MG/ML IJ SOLN
INTRAMUSCULAR | Status: DC | PRN
  Administered 2024-10-11: 5 mg via INTRAVENOUS

## 2024-10-11 MED ORDER — OXYCODONE HCL 5 MG PO TABS: 5.0000 mg | ORAL_TABLET | Freq: Once | ORAL | Status: DC | PRN

## 2024-10-11 MED ORDER — PROPOFOL 10 MG/ML IV BOLUS
INTRAVENOUS | Status: DC | PRN
  Administered 2024-10-11: 190 mg via INTRAVENOUS

## 2024-10-11 SURGICAL SUPPLY — 73 items
ALCOHOL 70% 16 OZ (MISCELLANEOUS) ×2 IMPLANT
BAG COUNTER SPONGE SURGICOUNT (BAG) ×2 IMPLANT
BANDAGE ESMARK 6X9 LF (GAUZE/BANDAGES/DRESSINGS) IMPLANT
BIT DRILL 2.5 CANN STRL (BIT) IMPLANT
BLADE SURG 11 STRL SS (BLADE) IMPLANT
BLADE SURG 15 STRL LF DISP TIS (BLADE) ×2 IMPLANT
BNDG COHESIVE 4X5 TAN STRL LF (GAUZE/BANDAGES/DRESSINGS) IMPLANT
BNDG COHESIVE 6X5 TAN ST LF (GAUZE/BANDAGES/DRESSINGS) IMPLANT
BNDG ELASTIC 4INX 5YD STR LF (GAUZE/BANDAGES/DRESSINGS) IMPLANT
BNDG ELASTIC 6INX 5YD STR LF (GAUZE/BANDAGES/DRESSINGS) IMPLANT
BNDG ELASTIC 6X10 VLCR STRL LF (GAUZE/BANDAGES/DRESSINGS) ×2 IMPLANT
CANISTER SUCTION 3000ML PPV (SUCTIONS) ×2 IMPLANT
CHLORAPREP W/TINT 26 (MISCELLANEOUS) ×4 IMPLANT
COVER MAYO STAND STRL (DRAPES) IMPLANT
COVER SURGICAL LIGHT HANDLE (MISCELLANEOUS) ×2 IMPLANT
CUFF TOURN SGL QUICK 42 (TOURNIQUET CUFF) IMPLANT
CUFF TRNQT CYL 34X4.125X (TOURNIQUET CUFF) ×2 IMPLANT
DRAPE ARTHROSCOPY W/POUCH 114 (DRAPES) ×2 IMPLANT
DRAPE C-ARM 42X120 X-RAY (DRAPES) IMPLANT
DRAPE C-ARMOR (DRAPES) IMPLANT
DRAPE OEC MINIVIEW 54X84 (DRAPES) ×2 IMPLANT
DRAPE U-SHAPE 47X51 STRL (DRAPES) ×2 IMPLANT
DRSG MEPITEL 4X7.2 (GAUZE/BANDAGES/DRESSINGS) ×2 IMPLANT
DRSG XEROFORM 1X8 (GAUZE/BANDAGES/DRESSINGS) ×4 IMPLANT
ELECTRODE REM PT RTRN 9FT ADLT (ELECTROSURGICAL) ×2 IMPLANT
EXCALIBUR 3.8MM X 13CM (MISCELLANEOUS) ×2 IMPLANT
GAUZE PAD ABD 8X10 STRL (GAUZE/BANDAGES/DRESSINGS) ×4 IMPLANT
GAUZE SPONGE 4X4 12PLY STRL (GAUZE/BANDAGES/DRESSINGS) ×2 IMPLANT
GAUZE SPONGE 4X4 12PLY STRL LF (GAUZE/BANDAGES/DRESSINGS) ×2 IMPLANT
GAUZE XEROFORM 1X8 LF (GAUZE/BANDAGES/DRESSINGS) ×2 IMPLANT
GAUZE XEROFORM 5X9 LF (GAUZE/BANDAGES/DRESSINGS) IMPLANT
GLOVE BIOGEL M STRL SZ7.5 (GLOVE) ×2 IMPLANT
GLOVE BIOGEL PI IND STRL 8 (GLOVE) ×2 IMPLANT
GLOVE SRG 8 PF TXTR STRL LF DI (GLOVE) ×2 IMPLANT
GLOVE SURG ENC TEXT LTX SZ7.5 (GLOVE) ×2 IMPLANT
GOWN STRL REUS W/ TWL LRG LVL3 (GOWN DISPOSABLE) ×2 IMPLANT
GOWN STRL REUS W/ TWL XL LVL3 (GOWN DISPOSABLE) ×4 IMPLANT
KIT BASIN OR (CUSTOM PROCEDURE TRAY) ×2 IMPLANT
KIT TURNOVER KIT B (KITS) ×2 IMPLANT
NDL 18GX1X1/2 (RX/OR ONLY) (NEEDLE) ×2 IMPLANT
NEEDLE 18GX1X1/2 (RX/OR ONLY) (NEEDLE) ×2 IMPLANT
PACK ORTHO EXTREMITY (CUSTOM PROCEDURE TRAY) ×2 IMPLANT
PAD ARMBOARD POSITIONER FOAM (MISCELLANEOUS) ×4 IMPLANT
PAD CAST 4YDX4 CTTN HI CHSV (CAST SUPPLIES) ×2 IMPLANT
PENCIL BUTTON HOLSTER BLD 10FT (ELECTRODE) IMPLANT
PLATE LOCK THIRD TUBULAR 4H (Plate) IMPLANT
SCREW ANKLE FT 3.5X60 (Screw) IMPLANT
SCREW LOCK 16X3.5XNS LF TI (Screw) IMPLANT
SCREW LP TI 3.5X14MM (Screw) IMPLANT
SCREW NL FT KREULOCK 3.5X50 (Screw) IMPLANT
SOLN 0.9% NACL POUR BTL 1000ML (IV SOLUTION) ×2 IMPLANT
SPIKE FLUID TRANSFER (MISCELLANEOUS) IMPLANT
SPLINT PLASTER CAST FAST 5X30 (CAST SUPPLIES) IMPLANT
SPONGE T-LAP 18X18 ~~LOC~~+RFID (SPONGE) ×2 IMPLANT
STAPLER SKIN PROX 35W (STAPLE) IMPLANT
STRAP ANKLE DISTRACTOR (MISCELLANEOUS) IMPLANT
STRAP ANKLE FOOT DISTRACTOR (ORTHOPEDIC SUPPLIES) ×2 IMPLANT
STRIP CLOSURE SKIN 1/2X4 (GAUZE/BANDAGES/DRESSINGS) IMPLANT
SUCTION TUBE FRAZIER 10FR DISP (SUCTIONS) ×2 IMPLANT
SUT ETHILON 3 0 PS 1 (SUTURE) ×2 IMPLANT
SUT MNCRL AB 3-0 PS2 18 (SUTURE) ×2 IMPLANT
SUT MNCRL AB 3-0 PS2 27 (SUTURE) ×2 IMPLANT
SUT PDS AB 2-0 CT2 27 (SUTURE) ×2 IMPLANT
SUT VIC AB 2-0 CT1 TAPERPNT 27 (SUTURE) ×4 IMPLANT
SUT VIC AB 3-0 FS2 27 (SUTURE) IMPLANT
SYR 20ML LL LF (SYRINGE) ×2 IMPLANT
TOWEL GREEN STERILE (TOWEL DISPOSABLE) ×2 IMPLANT
TOWEL GREEN STERILE FF (TOWEL DISPOSABLE) ×2 IMPLANT
TUBE CONNECTING 12X1/4 (SUCTIONS) ×2 IMPLANT
TUBE CONNECTING 20X1/4 (TUBING) ×2 IMPLANT
TUBING ARTHROSCOPY IRRIG 16FT (MISCELLANEOUS) ×2 IMPLANT
UNDERPAD 30X36 HEAVY ABSORB (UNDERPADS AND DIAPERS) ×2 IMPLANT
YANKAUER SUCT BULB TIP NO VENT (SUCTIONS) IMPLANT
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
## 2024-10-11 NOTE — Anesthesia Procedure Notes (Signed)
 Anesthesia Regional Block: Adductor canal block   Pre-Anesthetic Checklist: , timeout performed,  Correct Patient, Correct Site, Correct Laterality,  Correct Procedure, Correct Position, site marked,  Risks and benefits discussed,  Surgical consent,  Pre-op evaluation,  At surgeon's request and post-op pain management  Laterality: Lower and Right  Prep: chloraprep       Needles:  Injection technique: Single-shot  Needle Type: Echogenic Needle     Needle Length: 9cm  Needle Gauge: 21     Additional Needles:   Procedures:,,,, ultrasound used (permanent image in chart),,    Narrative:  Start time: 10/11/2024 11:14 AM End time: 10/11/2024 11:17 AM Injection made incrementally with aspirations every 5 mL.  Performed by: Personally  Anesthesiologist: Boone Fess, MD  Additional Notes: Patient's chart reviewed and they were deemed appropriate candidate for procedure, per surgeon's request. Patient educated about risks, benefits, and alternatives of the block including but not limited to: temporary or permanent nerve damage, bleeding, infection, damage to surround tissues, block failure, local anesthetic toxicity. Patient expressed understanding. A formal time-out was conducted consistent with institution rules.  Monitors were applied, and minimal sedation used (see nursing record). The site was prepped with skin prep and allowed to dry, and sterile gloves were used. A high frequency linear ultrasound probe with probe cover was utilized throughout. Femoral artery visualized at mid-thigh level, local anesthetic injected anterolateral to it, and echogenic block needle trajectory was monitored throughout. Hydrodissection of saphenous nerve visualized and appeared anatomically normal. Aspiration performed every 5ml. Blood vessels were avoided. All injections were performed without resistance and free of blood and paresthesias. The patient tolerated the procedure well.  Injectate: 20ml 0.25%  bupivacaine

## 2024-10-11 NOTE — Anesthesia Postprocedure Evaluation (Signed)
 Anesthesia Post Note  Patient: [name]  Procedure(s) Performed: ARTHROSCOPY, ANKLE WITH DEBRIDEMENT (Right) REPAIR, SYNDESMOSIS, ANKLE (Right) OPEN REDUCTION INTERNAL FIXATION (ORIF) ANKLE FRACTURE (Right: Ankle) CLOSED REDUCTION, FRACTURE, FIBULA (Right)     Patient location during evaluation: PACU Anesthesia Type: General Level of consciousness: awake and alert Pain management: pain level controlled Vital Signs Assessment: post-procedure vital signs reviewed and stable Respiratory status: spontaneous breathing, nonlabored ventilation, respiratory function stable and patient connected to nasal cannula oxygen Cardiovascular status: blood pressure returned to baseline and stable Postop Assessment: no apparent nausea or vomiting Anesthetic complications: no   No notable events documented.  Last Vitals:  Vitals:   10/11/24 1336 10/11/24 1345  BP: (!) 156/101 (!) 144/102  Pulse: 71 82  Resp: 19 (!) 22  Temp: 37.1 C   SpO2: 100% 98%    Last Pain:  Vitals:   10/11/24 1336  TempSrc:   PainSc: 0-No pain                 [name]

## 2024-10-11 NOTE — Transfer of Care (Signed)
 Immediate Anesthesia Transfer of Care Note  Patient: Dustin Wheeler  Procedure(s) Performed: ARTHROSCOPY, ANKLE WITH DEBRIDEMENT (Right) REPAIR, SYNDESMOSIS, ANKLE (Right) OPEN REDUCTION INTERNAL FIXATION (ORIF) ANKLE FRACTURE (Right: Ankle) CLOSED REDUCTION, FRACTURE, FIBULA (Right)  Patient Location: PACU  Anesthesia Type:General  Level of Consciousness: awake, alert , and oriented  Airway & Oxygen Therapy: Patient Spontanous Breathing and Patient connected to face mask oxygen  Post-op Assessment: Report given to RN and Post -op Vital signs reviewed and stable  Post vital signs: Reviewed and stable  Last Vitals:  Vitals Value Taken Time  BP 156/101 10/11/24 13:36  Temp 37.1 C 10/11/24 13:36  Pulse 68 10/11/24 13:40  Resp 18 10/11/24 13:40  SpO2 98 % 10/11/24 13:40  Vitals shown include unfiled device data.  Last Pain:  Vitals:   10/11/24 1336  TempSrc:   PainSc: 0-No pain         Complications: No notable events documented.

## 2024-10-11 NOTE — Discharge Instructions (Signed)
 [facility] POST-OP INSTRUCTIONS   Pain Management The numbing medicine and your leg will last around 18 hours, take a dose of your pain medicine as soon as you feel it wearing off to avoid rebound pain. Keep your foot elevated above heart level.  Make sure that your heel hangs free ('floats'). Take all prescribed medication as directed. If taking narcotic pain medication you may want to use an over-the-counter stool softener to avoid constipation. You may take over-the-counter NSAIDs (ibuprofen, naproxen, etc.) as well as over-the-counter acetaminophen  as directed on the packaging as a supplement for your pain and may also use it to wean away from the prescription medication.  Activity Non-weightbearing Keep splint intact  First Postoperative Visit Your first postop visit will be at least 2 weeks after surgery.  This should be scheduled when you schedule surgery. If you do not have a postoperative visit scheduled please call [phone] to schedule an appointment. At the appointment your incision will be evaluated for suture removal, x-rays will be obtained if necessary.  General Instructions Swelling is very common after foot and ankle surgery.  It often takes 3 months for the foot and ankle to begin to feel comfortable.  Some amount of swelling will persist for 6-12 months. DO NOT change the dressing.  If there is a problem with the dressing (too tight, loose, gets wet, etc.) please contact Dr. Isiah office. DO NOT get the dressing wet.  For showers you can use an over-the-counter cast cover or wrap a washcloth around the top of your dressing and then cover it with a plastic bag and tape it to your leg. DO NOT soak the incision (no tubs, pools, bath, etc.) until you have approval from Dr. ELSA.  Contact Dr. Nadara office or go to Emergency Room if: Temperature above 101 F. Increasing pain that is unresponsive to pain medication or elevation Excessive redness or  swelling in your foot Dressing problems - excessive bloody drainage, looseness or tightness, or if dressing gets wet Develop pain, swelling, warmth, or discoloration of your calf

## 2024-10-11 NOTE — Anesthesia Procedure Notes (Signed)
 Procedure Name: LMA Insertion Date/Time: 10/11/2024 12:22 PM  Performed by: Tevin Shillingford J, CRNAPre-anesthesia Checklist: Patient identified, Emergency Drugs available, Suction available and Patient being monitored Patient Re-evaluated:Patient Re-evaluated prior to induction Oxygen Delivery Method: Circle System Utilized Preoxygenation: Pre-oxygenation with 100% oxygen Induction Type: IV induction Ventilation: Mask ventilation without difficulty LMA: LMA inserted LMA Size: 5.0 Number of attempts: 1 Airway Equipment and Method: Bite block Placement Confirmation: positive ETCO2 Tube secured with: Tape Dental Injury: Teeth and Oropharynx as per pre-operative assessment

## 2024-10-11 NOTE — Anesthesia Procedure Notes (Signed)
 Anesthesia Regional Block: Popliteal block   Pre-Anesthetic Checklist: , timeout performed,  Correct Patient, Correct Site, Correct Laterality,  Correct Procedure, Correct Position, site marked,  Risks and benefits discussed,  Surgical consent,  Pre-op evaluation,  At surgeon's request and post-op pain management  Laterality: Lower and Right  Prep: chloraprep       Needles:  Injection technique: Single-shot  Needle Type: Echogenic Needle     Needle Length: 9cm  Needle Gauge: 21     Additional Needles:   Procedures:,,,, ultrasound used (permanent image in chart),,    Narrative:  Start time: 10/11/2024 11:12 AM End time: 10/11/2024 11:14 AM Injection made incrementally with aspirations every 5 mL.  Performed by: Personally  Anesthesiologist: Boone Fess, MD  Additional Notes: Patient's chart reviewed and they were deemed appropriate candidate for procedure, at surgeon's request. Patient educated about risks, benefits, and alternatives of the block including but not limited to: temporary or permanent nerve damage, bleeding, infection, damage to surround tissues, block failure, local anesthetic toxicity. Patient expressed understanding. A formal time-out was conducted consistent with institution rules.  Monitors were applied, and minimal sedation used. The site was prepped with skin prep and allowed to dry, and sterile gloves were used. A high frequency linear ultrasound probe with probe cover was utilized throughout. Popliteal artery pulsatile and visualized in popliteal fossa along with adjacent sciatic nerve and its branch point, which appeared anatomically normal, local anesthetic injected around them just proximal to the branch point, and echogenic block needle trajectory was monitored throughout. Aspiration performed every 5ml. Blood vessels were avoided. All injections were performed without resistance and free of blood and paresthesias. The patient tolerated the procedure  well.  Injectate: 20ml 0.25% bupivacaine

## 2024-10-11 NOTE — H&P (Signed)
 PREOPERATIVE H&P  Chief Complaint: Right ankle and leg pain  HPI: Dustin Wheeler is a 33 y.o. male who presents for preoperative history and physical with a diagnosis of right proximal fibula fracture with syndesmosis disruption distally consistent with Maisonneuve injury.  Posterior malleolar fracture.  He sustained the injury after a fall.  He is here today for surgery. Symptoms are rated as moderate to severe, and have been worsening.  This is significantly impairing activities of daily living.  He has elected for surgical management.   Past Medical History:  Diagnosis Date   Restless leg syndrome    pt denies   Past Surgical History:  Procedure Laterality Date   ESOPHAGOGASTRODUODENOSCOPY     Social History   Socioeconomic History   Marital status: Married    Spouse name: Not on file   Number of children: Not on file   Years of education: Not on file   Highest education level: Not on file  Occupational History   Not on file  Tobacco Use   Smoking status: Every Day    Current packs/day: 0.50    Types: Cigarettes   Smokeless tobacco: Never  Vaping Use   Vaping status: Never Used  Substance and Sexual Activity   Alcohol use: Yes    Comment: occassionally   Drug use: Not Currently   Sexual activity: Yes    Birth control/protection: None  Other Topics Concern   Not on file  Social History Narrative   Not on file   Social Drivers of Health   Financial Resource Strain: Low Risk  (07/06/2023)   Received from [facility]   Overall Financial Resource Strain (CARDIA)    Difficulty of Paying Living Expenses: Not hard at all  Food Insecurity: No Food Insecurity (07/06/2023)   Received from [facility]   Hunger Vital Sign    Within the past 12 months, you worried that your food would run out before you got the money to buy more.: Never true    Within the past 12 months, the food you bought just didn't last and you didn't have money to get more.: Never true  Transportation  Needs: No Transportation Needs (07/06/2023)   Received from [facility] - [facility]    Lack of Transportation (Medical): No    Lack of Transportation (Non-Medical): No  Physical Activity: Not on file  Stress: Not on file  Social Connections: Unknown (04/26/2022)   Received from [facility]   Social Network    Social Network: Not on file   History reviewed. No pertinent family history. No Known Allergies Prior to Admission medications   Medication Sig Start Date End Date Taking? Authorizing Provider  acetaminophen  (TYLENOL ) 500 MG tablet Take 1,000 mg by mouth every 6 (six) hours as needed for mild pain (pain score 1-3) or moderate pain (pain score 4-6).   Yes [provider]  omeprazole (PRILOSEC) 20 MG capsule Take 20 mg by mouth daily. 10/07/24 01/05/25 Yes [provider]     Positive ROS: All other systems have been reviewed and were otherwise negative with the exception of those mentioned in the HPI and as above.  Physical Exam:  Vitals:   10/11/24 0853  BP: (!) 150/91  Pulse: 67  Resp: 17  Temp: 98.7 F (37.1 C)  SpO2: 100%   General: Alert, no acute distress Cardiovascular: No pedal edema Respiratory: No cyanosis, no use of accessory musculature GI: No organomegaly, abdomen is soft and non-tender Skin: No lesions  in the area of chief complaint Neurologic: Sensation intact distally Psychiatric: Patient is competent for consent with normal mood and affect Lymphatic: No axillary or cervical lymphadenopathy  MUSCULOSKELETAL: Right ankle in a short leg splint.  Clinically is well aligned.  No tenderness distal or proximal to the splint.  Some lateral proximal fibular tenderness.  No significant pain with knee range of motion.  Good strength in all planes.  Foot is warm and well-perfused with intact sensation  Assessment: Right proximal fibula fracture and syndesmosis instability consistent with Maisonneuve injury.  Posterior malleolus  fracture with likely internal derangement of the ankle joint   Plan: Plan for ankle arthroscopic debridement with open treatment of the syndesmosis.  Will consider treatment of the posterior malleolus fracture intraoperatively.  Will plan for closed treatment of his proximal fibula fracture with manipulation.  We discussed the risks, benefits and alternatives of surgery which include but are not limited to wound healing complications, infection, nonunion, malunion, need for further surgery, damage to surrounding structures and continued pain.  They understand there is no guarantees to an acceptable outcome.  After weighing these risks they opted to proceed with surgery.     Lonni JONELLE Pae, MD    10/11/2024 11:03 AM

## 2024-10-12 ENCOUNTER — Encounter (HOSPITAL_COMMUNITY): Payer: Self-pay | Admitting: Orthopaedic Surgery
